# Patient Record
Sex: Male | Born: 1935 | Race: White | Hispanic: No | Marital: Married | Smoking: Current every day smoker
Health system: Southern US, Community
[De-identification: ages and names within clinical notes are randomized; demographics above are authoritative.]

## PROBLEM LIST (undated history)

## (undated) DIAGNOSIS — N289 Disorder of kidney and ureter, unspecified: Secondary | ICD-10-CM

## (undated) DIAGNOSIS — J449 Chronic obstructive pulmonary disease, unspecified: Secondary | ICD-10-CM

## (undated) DIAGNOSIS — C649 Malignant neoplasm of unspecified kidney, except renal pelvis: Secondary | ICD-10-CM

## (undated) DIAGNOSIS — M1990 Unspecified osteoarthritis, unspecified site: Secondary | ICD-10-CM

## (undated) DIAGNOSIS — E559 Vitamin D deficiency, unspecified: Secondary | ICD-10-CM

## (undated) DIAGNOSIS — H269 Unspecified cataract: Secondary | ICD-10-CM

## (undated) DIAGNOSIS — K219 Gastro-esophageal reflux disease without esophagitis: Secondary | ICD-10-CM

## (undated) DIAGNOSIS — I10 Essential (primary) hypertension: Secondary | ICD-10-CM

## (undated) DIAGNOSIS — C61 Malignant neoplasm of prostate: Secondary | ICD-10-CM

## (undated) DIAGNOSIS — E785 Hyperlipidemia, unspecified: Secondary | ICD-10-CM

## (undated) DIAGNOSIS — I2510 Atherosclerotic heart disease of native coronary artery without angina pectoris: Secondary | ICD-10-CM

## (undated) DIAGNOSIS — K644 Residual hemorrhoidal skin tags: Secondary | ICD-10-CM

## (undated) DIAGNOSIS — J189 Pneumonia, unspecified organism: Secondary | ICD-10-CM

## (undated) DIAGNOSIS — E7800 Pure hypercholesterolemia, unspecified: Secondary | ICD-10-CM

## (undated) DIAGNOSIS — R634 Abnormal weight loss: Secondary | ICD-10-CM

## (undated) DIAGNOSIS — I719 Aortic aneurysm of unspecified site, without rupture: Secondary | ICD-10-CM

## (undated) HISTORY — PX: COLON SURGERY: SHX602

## (undated) HISTORY — DX: Pure hypercholesterolemia, unspecified: E78.00

## (undated) HISTORY — DX: Essential (primary) hypertension: I10

## (undated) HISTORY — DX: Vitamin D deficiency, unspecified: E55.9

## (undated) HISTORY — DX: Malignant neoplasm of prostate: C61

## (undated) HISTORY — DX: Malignant neoplasm of unspecified kidney, except renal pelvis: C64.9

## (undated) HISTORY — DX: Aortic aneurysm of unspecified site, without rupture: I71.9

## (undated) HISTORY — DX: Disorder of kidney and ureter, unspecified: N28.9

## (undated) HISTORY — DX: Atherosclerotic heart disease of native coronary artery without angina pectoris: I25.10

## (undated) HISTORY — DX: Chronic obstructive pulmonary disease, unspecified: J44.9

## (undated) HISTORY — DX: Abnormal weight loss: R63.4

## (undated) HISTORY — DX: Hyperlipidemia, unspecified: E78.5

## (undated) HISTORY — DX: Residual hemorrhoidal skin tags: K64.4

## (undated) HISTORY — DX: Unspecified osteoarthritis, unspecified site: M19.90

## (undated) HISTORY — DX: Unspecified cataract: H26.9

## (undated) HISTORY — DX: Pneumonia, unspecified organism: J18.9

---

## 1995-01-27 DIAGNOSIS — C649 Malignant neoplasm of unspecified kidney, except renal pelvis: Secondary | ICD-10-CM

## 1995-01-27 HISTORY — PX: KIDNEY SURGERY: SHX687

## 1995-01-27 HISTORY — DX: Malignant neoplasm of unspecified kidney, except renal pelvis: C64.9

## 1998-01-26 DIAGNOSIS — I2510 Atherosclerotic heart disease of native coronary artery without angina pectoris: Secondary | ICD-10-CM | POA: Insufficient documentation

## 1998-01-26 HISTORY — DX: Atherosclerotic heart disease of native coronary artery without angina pectoris: I25.10

## 1998-03-26 ENCOUNTER — Inpatient Hospital Stay (HOSPITAL_COMMUNITY): Admission: AD | Admit: 1998-03-26 | Discharge: 1998-03-28 | Payer: Self-pay | Admitting: *Deleted

## 1998-10-10 ENCOUNTER — Ambulatory Visit (HOSPITAL_COMMUNITY): Admission: RE | Admit: 1998-10-10 | Discharge: 1998-10-10 | Payer: Self-pay

## 1999-08-06 ENCOUNTER — Ambulatory Visit (HOSPITAL_COMMUNITY): Admission: RE | Admit: 1999-08-06 | Discharge: 1999-08-06 | Payer: Self-pay

## 2011-04-06 DIAGNOSIS — E785 Hyperlipidemia, unspecified: Secondary | ICD-10-CM | POA: Diagnosis not present

## 2011-04-06 DIAGNOSIS — M25569 Pain in unspecified knee: Secondary | ICD-10-CM | POA: Diagnosis not present

## 2011-04-06 DIAGNOSIS — I10 Essential (primary) hypertension: Secondary | ICD-10-CM | POA: Diagnosis not present

## 2011-04-06 DIAGNOSIS — I719 Aortic aneurysm of unspecified site, without rupture: Secondary | ICD-10-CM | POA: Diagnosis not present

## 2011-04-08 DIAGNOSIS — Z6828 Body mass index (BMI) 28.0-28.9, adult: Secondary | ICD-10-CM | POA: Diagnosis not present

## 2011-04-08 DIAGNOSIS — M25569 Pain in unspecified knee: Secondary | ICD-10-CM | POA: Diagnosis not present

## 2011-04-08 DIAGNOSIS — R5381 Other malaise: Secondary | ICD-10-CM | POA: Diagnosis not present

## 2011-04-08 DIAGNOSIS — J111 Influenza due to unidentified influenza virus with other respiratory manifestations: Secondary | ICD-10-CM | POA: Diagnosis not present

## 2011-04-08 DIAGNOSIS — M2550 Pain in unspecified joint: Secondary | ICD-10-CM | POA: Diagnosis not present

## 2011-04-21 DIAGNOSIS — R5383 Other fatigue: Secondary | ICD-10-CM | POA: Diagnosis not present

## 2011-04-21 DIAGNOSIS — I10 Essential (primary) hypertension: Secondary | ICD-10-CM | POA: Diagnosis not present

## 2011-04-21 DIAGNOSIS — B9789 Other viral agents as the cause of diseases classified elsewhere: Secondary | ICD-10-CM | POA: Diagnosis not present

## 2011-05-14 DIAGNOSIS — E785 Hyperlipidemia, unspecified: Secondary | ICD-10-CM | POA: Diagnosis not present

## 2011-05-14 DIAGNOSIS — I2510 Atherosclerotic heart disease of native coronary artery without angina pectoris: Secondary | ICD-10-CM | POA: Diagnosis not present

## 2011-05-14 DIAGNOSIS — I10 Essential (primary) hypertension: Secondary | ICD-10-CM | POA: Diagnosis not present

## 2011-05-14 DIAGNOSIS — I719 Aortic aneurysm of unspecified site, without rupture: Secondary | ICD-10-CM | POA: Diagnosis not present

## 2011-05-19 DIAGNOSIS — I2510 Atherosclerotic heart disease of native coronary artery without angina pectoris: Secondary | ICD-10-CM | POA: Diagnosis not present

## 2011-07-27 DIAGNOSIS — M1990 Unspecified osteoarthritis, unspecified site: Secondary | ICD-10-CM | POA: Diagnosis not present

## 2011-07-27 DIAGNOSIS — Z0000 Encounter for general adult medical examination without abnormal findings: Secondary | ICD-10-CM | POA: Diagnosis not present

## 2011-07-27 DIAGNOSIS — Z6828 Body mass index (BMI) 28.0-28.9, adult: Secondary | ICD-10-CM | POA: Diagnosis not present

## 2011-07-27 DIAGNOSIS — R5381 Other malaise: Secondary | ICD-10-CM | POA: Diagnosis not present

## 2011-07-27 DIAGNOSIS — Z1212 Encounter for screening for malignant neoplasm of rectum: Secondary | ICD-10-CM | POA: Diagnosis not present

## 2011-07-27 DIAGNOSIS — I10 Essential (primary) hypertension: Secondary | ICD-10-CM | POA: Diagnosis not present

## 2011-07-27 DIAGNOSIS — Z125 Encounter for screening for malignant neoplasm of prostate: Secondary | ICD-10-CM | POA: Diagnosis not present

## 2011-10-28 DIAGNOSIS — E785 Hyperlipidemia, unspecified: Secondary | ICD-10-CM | POA: Diagnosis not present

## 2011-10-28 DIAGNOSIS — I10 Essential (primary) hypertension: Secondary | ICD-10-CM | POA: Diagnosis not present

## 2011-10-28 DIAGNOSIS — Z6828 Body mass index (BMI) 28.0-28.9, adult: Secondary | ICD-10-CM | POA: Diagnosis not present

## 2011-10-28 DIAGNOSIS — I719 Aortic aneurysm of unspecified site, without rupture: Secondary | ICD-10-CM | POA: Diagnosis not present

## 2011-10-28 DIAGNOSIS — Z79899 Other long term (current) drug therapy: Secondary | ICD-10-CM | POA: Diagnosis not present

## 2011-11-10 DIAGNOSIS — Z1211 Encounter for screening for malignant neoplasm of colon: Secondary | ICD-10-CM | POA: Diagnosis not present

## 2011-11-10 DIAGNOSIS — Z86010 Personal history of colonic polyps: Secondary | ICD-10-CM | POA: Diagnosis not present

## 2011-11-12 DIAGNOSIS — I2510 Atherosclerotic heart disease of native coronary artery without angina pectoris: Secondary | ICD-10-CM | POA: Diagnosis not present

## 2011-11-12 DIAGNOSIS — I10 Essential (primary) hypertension: Secondary | ICD-10-CM | POA: Diagnosis not present

## 2011-11-12 DIAGNOSIS — I719 Aortic aneurysm of unspecified site, without rupture: Secondary | ICD-10-CM | POA: Diagnosis not present

## 2011-11-12 DIAGNOSIS — E785 Hyperlipidemia, unspecified: Secondary | ICD-10-CM | POA: Diagnosis not present

## 2011-12-09 DIAGNOSIS — I2510 Atherosclerotic heart disease of native coronary artery without angina pectoris: Secondary | ICD-10-CM | POA: Diagnosis not present

## 2011-12-09 DIAGNOSIS — I10 Essential (primary) hypertension: Secondary | ICD-10-CM | POA: Diagnosis not present

## 2011-12-09 DIAGNOSIS — Z1211 Encounter for screening for malignant neoplasm of colon: Secondary | ICD-10-CM | POA: Diagnosis not present

## 2011-12-09 DIAGNOSIS — J449 Chronic obstructive pulmonary disease, unspecified: Secondary | ICD-10-CM | POA: Diagnosis not present

## 2011-12-09 DIAGNOSIS — Z79899 Other long term (current) drug therapy: Secondary | ICD-10-CM | POA: Diagnosis not present

## 2011-12-09 DIAGNOSIS — Z9861 Coronary angioplasty status: Secondary | ICD-10-CM | POA: Diagnosis not present

## 2011-12-09 DIAGNOSIS — K5730 Diverticulosis of large intestine without perforation or abscess without bleeding: Secondary | ICD-10-CM | POA: Diagnosis not present

## 2011-12-09 DIAGNOSIS — E7800 Pure hypercholesterolemia, unspecified: Secondary | ICD-10-CM | POA: Diagnosis not present

## 2011-12-09 DIAGNOSIS — D126 Benign neoplasm of colon, unspecified: Secondary | ICD-10-CM | POA: Diagnosis not present

## 2011-12-09 DIAGNOSIS — Z86010 Personal history of colonic polyps: Secondary | ICD-10-CM | POA: Diagnosis not present

## 2011-12-09 DIAGNOSIS — Z7982 Long term (current) use of aspirin: Secondary | ICD-10-CM | POA: Diagnosis not present

## 2011-12-09 HISTORY — PX: COLONOSCOPY: SHX174

## 2012-05-24 DIAGNOSIS — I719 Aortic aneurysm of unspecified site, without rupture: Secondary | ICD-10-CM | POA: Diagnosis not present

## 2012-05-24 DIAGNOSIS — I10 Essential (primary) hypertension: Secondary | ICD-10-CM | POA: Diagnosis not present

## 2012-05-24 DIAGNOSIS — I2510 Atherosclerotic heart disease of native coronary artery without angina pectoris: Secondary | ICD-10-CM | POA: Diagnosis not present

## 2012-05-24 DIAGNOSIS — E785 Hyperlipidemia, unspecified: Secondary | ICD-10-CM | POA: Diagnosis not present

## 2012-05-30 DIAGNOSIS — I714 Abdominal aortic aneurysm, without rupture: Secondary | ICD-10-CM | POA: Diagnosis not present

## 2012-06-02 DIAGNOSIS — E782 Mixed hyperlipidemia: Secondary | ICD-10-CM | POA: Diagnosis not present

## 2012-06-02 DIAGNOSIS — E559 Vitamin D deficiency, unspecified: Secondary | ICD-10-CM | POA: Diagnosis not present

## 2012-06-02 DIAGNOSIS — M1990 Unspecified osteoarthritis, unspecified site: Secondary | ICD-10-CM | POA: Diagnosis not present

## 2012-06-02 DIAGNOSIS — Z125 Encounter for screening for malignant neoplasm of prostate: Secondary | ICD-10-CM | POA: Diagnosis not present

## 2012-06-02 DIAGNOSIS — Z79899 Other long term (current) drug therapy: Secondary | ICD-10-CM | POA: Diagnosis not present

## 2012-06-02 DIAGNOSIS — Z0000 Encounter for general adult medical examination without abnormal findings: Secondary | ICD-10-CM | POA: Diagnosis not present

## 2012-06-02 DIAGNOSIS — E785 Hyperlipidemia, unspecified: Secondary | ICD-10-CM | POA: Diagnosis not present

## 2012-06-02 DIAGNOSIS — I10 Essential (primary) hypertension: Secondary | ICD-10-CM | POA: Diagnosis not present

## 2012-06-13 DIAGNOSIS — I10 Essential (primary) hypertension: Secondary | ICD-10-CM | POA: Diagnosis not present

## 2012-06-13 DIAGNOSIS — I719 Aortic aneurysm of unspecified site, without rupture: Secondary | ICD-10-CM | POA: Diagnosis not present

## 2012-06-13 DIAGNOSIS — E785 Hyperlipidemia, unspecified: Secondary | ICD-10-CM | POA: Diagnosis not present

## 2012-06-23 DIAGNOSIS — M7710 Lateral epicondylitis, unspecified elbow: Secondary | ICD-10-CM | POA: Diagnosis not present

## 2012-06-23 DIAGNOSIS — I719 Aortic aneurysm of unspecified site, without rupture: Secondary | ICD-10-CM | POA: Diagnosis not present

## 2012-06-23 DIAGNOSIS — I714 Abdominal aortic aneurysm, without rupture: Secondary | ICD-10-CM | POA: Diagnosis not present

## 2012-09-08 DIAGNOSIS — I719 Aortic aneurysm of unspecified site, without rupture: Secondary | ICD-10-CM | POA: Diagnosis not present

## 2012-09-08 DIAGNOSIS — M1990 Unspecified osteoarthritis, unspecified site: Secondary | ICD-10-CM | POA: Diagnosis not present

## 2012-09-08 DIAGNOSIS — I10 Essential (primary) hypertension: Secondary | ICD-10-CM | POA: Diagnosis not present

## 2012-09-08 DIAGNOSIS — E785 Hyperlipidemia, unspecified: Secondary | ICD-10-CM | POA: Diagnosis not present

## 2012-10-30 DIAGNOSIS — S20229A Contusion of unspecified back wall of thorax, initial encounter: Secondary | ICD-10-CM | POA: Diagnosis not present

## 2012-10-30 DIAGNOSIS — S2249XA Multiple fractures of ribs, unspecified side, initial encounter for closed fracture: Secondary | ICD-10-CM | POA: Diagnosis not present

## 2012-10-30 DIAGNOSIS — Z79899 Other long term (current) drug therapy: Secondary | ICD-10-CM | POA: Diagnosis not present

## 2012-10-30 DIAGNOSIS — I2510 Atherosclerotic heart disease of native coronary artery without angina pectoris: Secondary | ICD-10-CM | POA: Diagnosis not present

## 2012-10-30 DIAGNOSIS — I10 Essential (primary) hypertension: Secondary | ICD-10-CM | POA: Diagnosis not present

## 2012-10-30 DIAGNOSIS — S27329A Contusion of lung, unspecified, initial encounter: Secondary | ICD-10-CM | POA: Diagnosis not present

## 2012-10-30 DIAGNOSIS — T1490XA Injury, unspecified, initial encounter: Secondary | ICD-10-CM | POA: Diagnosis not present

## 2012-10-30 DIAGNOSIS — J449 Chronic obstructive pulmonary disease, unspecified: Secondary | ICD-10-CM | POA: Diagnosis not present

## 2012-10-30 DIAGNOSIS — W010XXA Fall on same level from slipping, tripping and stumbling without subsequent striking against object, initial encounter: Secondary | ICD-10-CM | POA: Diagnosis not present

## 2012-10-30 DIAGNOSIS — R109 Unspecified abdominal pain: Secondary | ICD-10-CM | POA: Diagnosis not present

## 2012-10-30 DIAGNOSIS — R9431 Abnormal electrocardiogram [ECG] [EKG]: Secondary | ICD-10-CM | POA: Diagnosis not present

## 2012-10-30 DIAGNOSIS — F17200 Nicotine dependence, unspecified, uncomplicated: Secondary | ICD-10-CM | POA: Diagnosis not present

## 2012-11-03 DIAGNOSIS — J209 Acute bronchitis, unspecified: Secondary | ICD-10-CM | POA: Diagnosis not present

## 2012-11-03 DIAGNOSIS — S2239XA Fracture of one rib, unspecified side, initial encounter for closed fracture: Secondary | ICD-10-CM | POA: Diagnosis not present

## 2012-11-03 DIAGNOSIS — W19XXXA Unspecified fall, initial encounter: Secondary | ICD-10-CM | POA: Diagnosis not present

## 2012-12-13 DIAGNOSIS — Z79899 Other long term (current) drug therapy: Secondary | ICD-10-CM | POA: Diagnosis not present

## 2012-12-13 DIAGNOSIS — E785 Hyperlipidemia, unspecified: Secondary | ICD-10-CM | POA: Diagnosis not present

## 2012-12-13 DIAGNOSIS — I10 Essential (primary) hypertension: Secondary | ICD-10-CM | POA: Diagnosis not present

## 2012-12-13 DIAGNOSIS — E782 Mixed hyperlipidemia: Secondary | ICD-10-CM | POA: Diagnosis not present

## 2012-12-13 DIAGNOSIS — E039 Hypothyroidism, unspecified: Secondary | ICD-10-CM | POA: Diagnosis not present

## 2012-12-13 DIAGNOSIS — I719 Aortic aneurysm of unspecified site, without rupture: Secondary | ICD-10-CM | POA: Diagnosis not present

## 2012-12-13 DIAGNOSIS — E559 Vitamin D deficiency, unspecified: Secondary | ICD-10-CM | POA: Diagnosis not present

## 2012-12-21 DIAGNOSIS — I714 Abdominal aortic aneurysm, without rupture: Secondary | ICD-10-CM | POA: Diagnosis not present

## 2013-06-08 DIAGNOSIS — I2510 Atherosclerotic heart disease of native coronary artery without angina pectoris: Secondary | ICD-10-CM | POA: Diagnosis not present

## 2013-06-08 DIAGNOSIS — J45902 Unspecified asthma with status asthmaticus: Secondary | ICD-10-CM | POA: Diagnosis not present

## 2013-06-08 DIAGNOSIS — E039 Hypothyroidism, unspecified: Secondary | ICD-10-CM | POA: Diagnosis not present

## 2013-06-08 DIAGNOSIS — Z79899 Other long term (current) drug therapy: Secondary | ICD-10-CM | POA: Diagnosis not present

## 2013-06-08 DIAGNOSIS — E559 Vitamin D deficiency, unspecified: Secondary | ICD-10-CM | POA: Diagnosis not present

## 2013-06-08 DIAGNOSIS — E785 Hyperlipidemia, unspecified: Secondary | ICD-10-CM | POA: Diagnosis not present

## 2013-06-08 DIAGNOSIS — I719 Aortic aneurysm of unspecified site, without rupture: Secondary | ICD-10-CM | POA: Diagnosis not present

## 2013-06-08 DIAGNOSIS — J309 Allergic rhinitis, unspecified: Secondary | ICD-10-CM | POA: Diagnosis not present

## 2013-06-08 DIAGNOSIS — I10 Essential (primary) hypertension: Secondary | ICD-10-CM | POA: Diagnosis not present

## 2013-06-08 DIAGNOSIS — Z23 Encounter for immunization: Secondary | ICD-10-CM | POA: Diagnosis not present

## 2013-06-20 DIAGNOSIS — D485 Neoplasm of uncertain behavior of skin: Secondary | ICD-10-CM | POA: Diagnosis not present

## 2013-06-20 DIAGNOSIS — I714 Abdominal aortic aneurysm, without rupture, unspecified: Secondary | ICD-10-CM | POA: Diagnosis not present

## 2013-06-20 DIAGNOSIS — I2510 Atherosclerotic heart disease of native coronary artery without angina pectoris: Secondary | ICD-10-CM | POA: Diagnosis not present

## 2013-06-20 DIAGNOSIS — I10 Essential (primary) hypertension: Secondary | ICD-10-CM | POA: Diagnosis not present

## 2013-06-20 DIAGNOSIS — E785 Hyperlipidemia, unspecified: Secondary | ICD-10-CM | POA: Diagnosis not present

## 2013-06-20 DIAGNOSIS — L570 Actinic keratosis: Secondary | ICD-10-CM | POA: Diagnosis not present

## 2013-07-06 DIAGNOSIS — I723 Aneurysm of iliac artery: Secondary | ICD-10-CM | POA: Diagnosis not present

## 2013-07-06 DIAGNOSIS — I714 Abdominal aortic aneurysm, without rupture, unspecified: Secondary | ICD-10-CM | POA: Diagnosis not present

## 2013-07-20 DIAGNOSIS — L723 Sebaceous cyst: Secondary | ICD-10-CM | POA: Diagnosis not present

## 2013-09-26 DIAGNOSIS — Z0000 Encounter for general adult medical examination without abnormal findings: Secondary | ICD-10-CM | POA: Diagnosis not present

## 2013-09-26 DIAGNOSIS — Z125 Encounter for screening for malignant neoplasm of prostate: Secondary | ICD-10-CM | POA: Diagnosis not present

## 2013-09-26 DIAGNOSIS — I10 Essential (primary) hypertension: Secondary | ICD-10-CM | POA: Diagnosis not present

## 2013-10-13 DIAGNOSIS — M62838 Other muscle spasm: Secondary | ICD-10-CM | POA: Diagnosis not present

## 2013-12-18 DIAGNOSIS — I714 Abdominal aortic aneurysm, without rupture: Secondary | ICD-10-CM | POA: Diagnosis not present

## 2013-12-18 DIAGNOSIS — I10 Essential (primary) hypertension: Secondary | ICD-10-CM | POA: Diagnosis not present

## 2013-12-18 DIAGNOSIS — I2510 Atherosclerotic heart disease of native coronary artery without angina pectoris: Secondary | ICD-10-CM | POA: Diagnosis not present

## 2014-01-01 DIAGNOSIS — Z905 Acquired absence of kidney: Secondary | ICD-10-CM | POA: Diagnosis not present

## 2014-01-01 DIAGNOSIS — E559 Vitamin D deficiency, unspecified: Secondary | ICD-10-CM | POA: Diagnosis not present

## 2014-01-01 DIAGNOSIS — M1990 Unspecified osteoarthritis, unspecified site: Secondary | ICD-10-CM | POA: Diagnosis not present

## 2014-01-01 DIAGNOSIS — I10 Essential (primary) hypertension: Secondary | ICD-10-CM | POA: Diagnosis not present

## 2014-01-01 DIAGNOSIS — Z2821 Immunization not carried out because of patient refusal: Secondary | ICD-10-CM | POA: Diagnosis not present

## 2014-01-01 DIAGNOSIS — E039 Hypothyroidism, unspecified: Secondary | ICD-10-CM | POA: Diagnosis not present

## 2014-01-01 DIAGNOSIS — Z79899 Other long term (current) drug therapy: Secondary | ICD-10-CM | POA: Diagnosis not present

## 2014-01-01 DIAGNOSIS — E785 Hyperlipidemia, unspecified: Secondary | ICD-10-CM | POA: Diagnosis not present

## 2014-01-01 DIAGNOSIS — Z23 Encounter for immunization: Secondary | ICD-10-CM | POA: Diagnosis not present

## 2014-01-01 DIAGNOSIS — I719 Aortic aneurysm of unspecified site, without rupture: Secondary | ICD-10-CM | POA: Diagnosis not present

## 2014-01-02 DIAGNOSIS — L570 Actinic keratosis: Secondary | ICD-10-CM | POA: Diagnosis not present

## 2014-01-10 DIAGNOSIS — I723 Aneurysm of iliac artery: Secondary | ICD-10-CM | POA: Diagnosis not present

## 2014-01-10 DIAGNOSIS — I714 Abdominal aortic aneurysm, without rupture: Secondary | ICD-10-CM | POA: Diagnosis not present

## 2014-01-31 DIAGNOSIS — I10 Essential (primary) hypertension: Secondary | ICD-10-CM | POA: Diagnosis not present

## 2014-01-31 DIAGNOSIS — Z905 Acquired absence of kidney: Secondary | ICD-10-CM | POA: Diagnosis not present

## 2014-01-31 DIAGNOSIS — E785 Hyperlipidemia, unspecified: Secondary | ICD-10-CM | POA: Diagnosis not present

## 2014-01-31 DIAGNOSIS — I714 Abdominal aortic aneurysm, without rupture: Secondary | ICD-10-CM | POA: Diagnosis not present

## 2014-01-31 DIAGNOSIS — I723 Aneurysm of iliac artery: Secondary | ICD-10-CM | POA: Diagnosis not present

## 2014-04-03 DIAGNOSIS — I10 Essential (primary) hypertension: Secondary | ICD-10-CM | POA: Diagnosis not present

## 2014-04-03 DIAGNOSIS — Z905 Acquired absence of kidney: Secondary | ICD-10-CM | POA: Diagnosis not present

## 2014-04-03 DIAGNOSIS — M1990 Unspecified osteoarthritis, unspecified site: Secondary | ICD-10-CM | POA: Diagnosis not present

## 2014-04-03 DIAGNOSIS — C61 Malignant neoplasm of prostate: Secondary | ICD-10-CM | POA: Diagnosis not present

## 2014-04-03 DIAGNOSIS — E785 Hyperlipidemia, unspecified: Secondary | ICD-10-CM | POA: Diagnosis not present

## 2014-04-03 DIAGNOSIS — Q6100 Congenital renal cyst, unspecified: Secondary | ICD-10-CM | POA: Diagnosis not present

## 2014-04-03 DIAGNOSIS — I719 Aortic aneurysm of unspecified site, without rupture: Secondary | ICD-10-CM | POA: Diagnosis not present

## 2014-04-03 DIAGNOSIS — E039 Hypothyroidism, unspecified: Secondary | ICD-10-CM | POA: Diagnosis not present

## 2014-04-03 DIAGNOSIS — Z6827 Body mass index (BMI) 27.0-27.9, adult: Secondary | ICD-10-CM | POA: Diagnosis not present

## 2014-07-04 DIAGNOSIS — Z6826 Body mass index (BMI) 26.0-26.9, adult: Secondary | ICD-10-CM | POA: Diagnosis not present

## 2014-07-04 DIAGNOSIS — E039 Hypothyroidism, unspecified: Secondary | ICD-10-CM | POA: Diagnosis not present

## 2014-07-04 DIAGNOSIS — I10 Essential (primary) hypertension: Secondary | ICD-10-CM | POA: Diagnosis not present

## 2014-07-04 DIAGNOSIS — E559 Vitamin D deficiency, unspecified: Secondary | ICD-10-CM | POA: Diagnosis not present

## 2014-07-04 DIAGNOSIS — Z79899 Other long term (current) drug therapy: Secondary | ICD-10-CM | POA: Diagnosis not present

## 2014-07-04 DIAGNOSIS — M1990 Unspecified osteoarthritis, unspecified site: Secondary | ICD-10-CM | POA: Diagnosis not present

## 2014-07-04 DIAGNOSIS — N189 Chronic kidney disease, unspecified: Secondary | ICD-10-CM | POA: Diagnosis not present

## 2014-07-04 DIAGNOSIS — Z9181 History of falling: Secondary | ICD-10-CM | POA: Diagnosis not present

## 2014-07-04 DIAGNOSIS — E785 Hyperlipidemia, unspecified: Secondary | ICD-10-CM | POA: Diagnosis not present

## 2014-07-04 DIAGNOSIS — J019 Acute sinusitis, unspecified: Secondary | ICD-10-CM | POA: Diagnosis not present

## 2014-07-04 DIAGNOSIS — Z1389 Encounter for screening for other disorder: Secondary | ICD-10-CM | POA: Diagnosis not present

## 2014-08-10 DIAGNOSIS — I714 Abdominal aortic aneurysm, without rupture: Secondary | ICD-10-CM | POA: Diagnosis not present

## 2014-08-10 DIAGNOSIS — I723 Aneurysm of iliac artery: Secondary | ICD-10-CM | POA: Diagnosis not present

## 2014-08-10 DIAGNOSIS — I719 Aortic aneurysm of unspecified site, without rupture: Secondary | ICD-10-CM | POA: Diagnosis not present

## 2014-09-13 DIAGNOSIS — I10 Essential (primary) hypertension: Secondary | ICD-10-CM | POA: Diagnosis not present

## 2014-09-13 DIAGNOSIS — E785 Hyperlipidemia, unspecified: Secondary | ICD-10-CM | POA: Diagnosis not present

## 2014-09-13 DIAGNOSIS — I714 Abdominal aortic aneurysm, without rupture: Secondary | ICD-10-CM | POA: Diagnosis not present

## 2014-09-13 DIAGNOSIS — I723 Aneurysm of iliac artery: Secondary | ICD-10-CM | POA: Diagnosis not present

## 2014-09-26 DIAGNOSIS — I2510 Atherosclerotic heart disease of native coronary artery without angina pectoris: Secondary | ICD-10-CM | POA: Insufficient documentation

## 2014-09-26 DIAGNOSIS — E782 Mixed hyperlipidemia: Secondary | ICD-10-CM | POA: Insufficient documentation

## 2014-09-26 DIAGNOSIS — I440 Atrioventricular block, first degree: Secondary | ICD-10-CM | POA: Diagnosis not present

## 2014-09-26 DIAGNOSIS — I714 Abdominal aortic aneurysm, without rupture: Secondary | ICD-10-CM | POA: Diagnosis not present

## 2014-09-26 DIAGNOSIS — Z0000 Encounter for general adult medical examination without abnormal findings: Secondary | ICD-10-CM | POA: Diagnosis not present

## 2014-09-26 DIAGNOSIS — I10 Essential (primary) hypertension: Secondary | ICD-10-CM | POA: Insufficient documentation

## 2014-09-26 DIAGNOSIS — I452 Bifascicular block: Secondary | ICD-10-CM | POA: Diagnosis not present

## 2014-09-26 DIAGNOSIS — R001 Bradycardia, unspecified: Secondary | ICD-10-CM | POA: Diagnosis not present

## 2014-09-26 DIAGNOSIS — I4510 Unspecified right bundle-branch block: Secondary | ICD-10-CM | POA: Diagnosis not present

## 2014-09-26 HISTORY — DX: Atherosclerotic heart disease of native coronary artery without angina pectoris: I25.10

## 2014-09-26 HISTORY — DX: Mixed hyperlipidemia: E78.2

## 2014-09-26 HISTORY — DX: Essential (primary) hypertension: I10

## 2014-10-31 DIAGNOSIS — L570 Actinic keratosis: Secondary | ICD-10-CM | POA: Diagnosis not present

## 2014-10-31 DIAGNOSIS — C4441 Basal cell carcinoma of skin of scalp and neck: Secondary | ICD-10-CM | POA: Diagnosis not present

## 2014-12-12 DIAGNOSIS — Z6826 Body mass index (BMI) 26.0-26.9, adult: Secondary | ICD-10-CM | POA: Diagnosis not present

## 2014-12-12 DIAGNOSIS — E785 Hyperlipidemia, unspecified: Secondary | ICD-10-CM | POA: Diagnosis not present

## 2014-12-12 DIAGNOSIS — I10 Essential (primary) hypertension: Secondary | ICD-10-CM | POA: Diagnosis not present

## 2014-12-12 DIAGNOSIS — Z79899 Other long term (current) drug therapy: Secondary | ICD-10-CM | POA: Diagnosis not present

## 2014-12-12 DIAGNOSIS — E663 Overweight: Secondary | ICD-10-CM | POA: Diagnosis not present

## 2014-12-12 DIAGNOSIS — Z0000 Encounter for general adult medical examination without abnormal findings: Secondary | ICD-10-CM | POA: Diagnosis not present

## 2014-12-12 DIAGNOSIS — E039 Hypothyroidism, unspecified: Secondary | ICD-10-CM | POA: Diagnosis not present

## 2014-12-12 DIAGNOSIS — F439 Reaction to severe stress, unspecified: Secondary | ICD-10-CM | POA: Diagnosis not present

## 2014-12-12 DIAGNOSIS — E559 Vitamin D deficiency, unspecified: Secondary | ICD-10-CM | POA: Diagnosis not present

## 2015-02-12 DIAGNOSIS — I714 Abdominal aortic aneurysm, without rupture: Secondary | ICD-10-CM | POA: Diagnosis not present

## 2015-03-25 DIAGNOSIS — I714 Abdominal aortic aneurysm, without rupture: Secondary | ICD-10-CM | POA: Diagnosis not present

## 2015-03-25 DIAGNOSIS — E782 Mixed hyperlipidemia: Secondary | ICD-10-CM | POA: Diagnosis not present

## 2015-03-25 DIAGNOSIS — I10 Essential (primary) hypertension: Secondary | ICD-10-CM | POA: Diagnosis not present

## 2015-03-25 DIAGNOSIS — I2510 Atherosclerotic heart disease of native coronary artery without angina pectoris: Secondary | ICD-10-CM | POA: Diagnosis not present

## 2015-04-11 DIAGNOSIS — E663 Overweight: Secondary | ICD-10-CM | POA: Diagnosis not present

## 2015-04-11 DIAGNOSIS — M1990 Unspecified osteoarthritis, unspecified site: Secondary | ICD-10-CM | POA: Diagnosis not present

## 2015-04-11 DIAGNOSIS — E785 Hyperlipidemia, unspecified: Secondary | ICD-10-CM | POA: Diagnosis not present

## 2015-04-11 DIAGNOSIS — I719 Aortic aneurysm of unspecified site, without rupture: Secondary | ICD-10-CM | POA: Diagnosis not present

## 2015-04-11 DIAGNOSIS — R197 Diarrhea, unspecified: Secondary | ICD-10-CM | POA: Diagnosis not present

## 2015-04-11 DIAGNOSIS — E039 Hypothyroidism, unspecified: Secondary | ICD-10-CM | POA: Diagnosis not present

## 2015-04-11 DIAGNOSIS — N189 Chronic kidney disease, unspecified: Secondary | ICD-10-CM | POA: Diagnosis not present

## 2015-04-11 DIAGNOSIS — Z1389 Encounter for screening for other disorder: Secondary | ICD-10-CM | POA: Diagnosis not present

## 2015-04-11 DIAGNOSIS — I10 Essential (primary) hypertension: Secondary | ICD-10-CM | POA: Diagnosis not present

## 2015-04-11 DIAGNOSIS — E559 Vitamin D deficiency, unspecified: Secondary | ICD-10-CM | POA: Diagnosis not present

## 2015-04-11 DIAGNOSIS — Z6826 Body mass index (BMI) 26.0-26.9, adult: Secondary | ICD-10-CM | POA: Diagnosis not present

## 2015-04-15 DIAGNOSIS — E875 Hyperkalemia: Secondary | ICD-10-CM | POA: Diagnosis not present

## 2015-04-24 DIAGNOSIS — S8012XA Contusion of left lower leg, initial encounter: Secondary | ICD-10-CM | POA: Diagnosis not present

## 2015-04-24 DIAGNOSIS — S0091XA Abrasion of unspecified part of head, initial encounter: Secondary | ICD-10-CM | POA: Diagnosis not present

## 2015-04-24 DIAGNOSIS — S8011XA Contusion of right lower leg, initial encounter: Secondary | ICD-10-CM | POA: Diagnosis not present

## 2015-04-24 DIAGNOSIS — S0990XA Unspecified injury of head, initial encounter: Secondary | ICD-10-CM | POA: Diagnosis not present

## 2015-05-27 DIAGNOSIS — N2581 Secondary hyperparathyroidism of renal origin: Secondary | ICD-10-CM | POA: Diagnosis not present

## 2015-05-27 DIAGNOSIS — I10 Essential (primary) hypertension: Secondary | ICD-10-CM | POA: Diagnosis not present

## 2015-05-27 DIAGNOSIS — N189 Chronic kidney disease, unspecified: Secondary | ICD-10-CM | POA: Diagnosis not present

## 2015-05-27 DIAGNOSIS — D631 Anemia in chronic kidney disease: Secondary | ICD-10-CM | POA: Diagnosis not present

## 2015-07-19 DIAGNOSIS — N189 Chronic kidney disease, unspecified: Secondary | ICD-10-CM | POA: Diagnosis not present

## 2015-07-25 DIAGNOSIS — E785 Hyperlipidemia, unspecified: Secondary | ICD-10-CM | POA: Diagnosis not present

## 2015-07-29 DIAGNOSIS — N2581 Secondary hyperparathyroidism of renal origin: Secondary | ICD-10-CM | POA: Diagnosis not present

## 2015-07-29 DIAGNOSIS — E875 Hyperkalemia: Secondary | ICD-10-CM | POA: Diagnosis not present

## 2015-07-29 DIAGNOSIS — I10 Essential (primary) hypertension: Secondary | ICD-10-CM | POA: Diagnosis not present

## 2015-07-29 DIAGNOSIS — N183 Chronic kidney disease, stage 3 (moderate): Secondary | ICD-10-CM | POA: Diagnosis not present

## 2015-07-29 DIAGNOSIS — D631 Anemia in chronic kidney disease: Secondary | ICD-10-CM | POA: Diagnosis not present

## 2015-08-30 DIAGNOSIS — I723 Aneurysm of iliac artery: Secondary | ICD-10-CM | POA: Diagnosis not present

## 2015-08-30 DIAGNOSIS — I714 Abdominal aortic aneurysm, without rupture: Secondary | ICD-10-CM | POA: Diagnosis not present

## 2015-09-19 DIAGNOSIS — I714 Abdominal aortic aneurysm, without rupture: Secondary | ICD-10-CM | POA: Diagnosis not present

## 2015-09-25 DIAGNOSIS — I10 Essential (primary) hypertension: Secondary | ICD-10-CM | POA: Diagnosis not present

## 2015-09-25 DIAGNOSIS — E782 Mixed hyperlipidemia: Secondary | ICD-10-CM | POA: Diagnosis not present

## 2015-09-25 DIAGNOSIS — I2510 Atherosclerotic heart disease of native coronary artery without angina pectoris: Secondary | ICD-10-CM | POA: Diagnosis not present

## 2015-09-25 DIAGNOSIS — I714 Abdominal aortic aneurysm, without rupture: Secondary | ICD-10-CM | POA: Diagnosis not present

## 2015-10-09 DIAGNOSIS — I719 Aortic aneurysm of unspecified site, without rupture: Secondary | ICD-10-CM | POA: Diagnosis not present

## 2015-10-09 DIAGNOSIS — Z905 Acquired absence of kidney: Secondary | ICD-10-CM | POA: Diagnosis not present

## 2015-10-09 DIAGNOSIS — E663 Overweight: Secondary | ICD-10-CM | POA: Diagnosis not present

## 2015-10-09 DIAGNOSIS — E559 Vitamin D deficiency, unspecified: Secondary | ICD-10-CM | POA: Diagnosis not present

## 2015-10-09 DIAGNOSIS — Z6825 Body mass index (BMI) 25.0-25.9, adult: Secondary | ICD-10-CM | POA: Diagnosis not present

## 2015-10-09 DIAGNOSIS — I10 Essential (primary) hypertension: Secondary | ICD-10-CM | POA: Diagnosis not present

## 2015-10-09 DIAGNOSIS — Z9181 History of falling: Secondary | ICD-10-CM | POA: Diagnosis not present

## 2015-10-09 DIAGNOSIS — Z79899 Other long term (current) drug therapy: Secondary | ICD-10-CM | POA: Diagnosis not present

## 2015-10-09 DIAGNOSIS — C61 Malignant neoplasm of prostate: Secondary | ICD-10-CM | POA: Diagnosis not present

## 2015-10-09 DIAGNOSIS — E785 Hyperlipidemia, unspecified: Secondary | ICD-10-CM | POA: Diagnosis not present

## 2015-10-10 DIAGNOSIS — I2510 Atherosclerotic heart disease of native coronary artery without angina pectoris: Secondary | ICD-10-CM | POA: Diagnosis not present

## 2015-12-02 DIAGNOSIS — D631 Anemia in chronic kidney disease: Secondary | ICD-10-CM | POA: Diagnosis not present

## 2015-12-02 DIAGNOSIS — N2581 Secondary hyperparathyroidism of renal origin: Secondary | ICD-10-CM | POA: Diagnosis not present

## 2015-12-02 DIAGNOSIS — N183 Chronic kidney disease, stage 3 (moderate): Secondary | ICD-10-CM | POA: Diagnosis not present

## 2015-12-11 DIAGNOSIS — N183 Chronic kidney disease, stage 3 (moderate): Secondary | ICD-10-CM | POA: Diagnosis not present

## 2015-12-11 DIAGNOSIS — D631 Anemia in chronic kidney disease: Secondary | ICD-10-CM | POA: Diagnosis not present

## 2015-12-11 DIAGNOSIS — I10 Essential (primary) hypertension: Secondary | ICD-10-CM | POA: Diagnosis not present

## 2015-12-11 DIAGNOSIS — N2581 Secondary hyperparathyroidism of renal origin: Secondary | ICD-10-CM | POA: Diagnosis not present

## 2015-12-31 DIAGNOSIS — N183 Chronic kidney disease, stage 3 (moderate): Secondary | ICD-10-CM | POA: Diagnosis not present

## 2016-03-03 DIAGNOSIS — J309 Allergic rhinitis, unspecified: Secondary | ICD-10-CM | POA: Diagnosis not present

## 2016-03-03 DIAGNOSIS — I10 Essential (primary) hypertension: Secondary | ICD-10-CM | POA: Diagnosis not present

## 2016-03-03 DIAGNOSIS — Z79899 Other long term (current) drug therapy: Secondary | ICD-10-CM | POA: Diagnosis not present

## 2016-03-03 DIAGNOSIS — J45909 Unspecified asthma, uncomplicated: Secondary | ICD-10-CM | POA: Diagnosis not present

## 2016-03-03 DIAGNOSIS — E785 Hyperlipidemia, unspecified: Secondary | ICD-10-CM | POA: Diagnosis not present

## 2016-03-03 DIAGNOSIS — Z2821 Immunization not carried out because of patient refusal: Secondary | ICD-10-CM | POA: Diagnosis not present

## 2016-03-03 DIAGNOSIS — M1990 Unspecified osteoarthritis, unspecified site: Secondary | ICD-10-CM | POA: Diagnosis not present

## 2016-03-03 DIAGNOSIS — E039 Hypothyroidism, unspecified: Secondary | ICD-10-CM | POA: Diagnosis not present

## 2016-03-03 DIAGNOSIS — E663 Overweight: Secondary | ICD-10-CM | POA: Diagnosis not present

## 2016-03-03 DIAGNOSIS — Z6826 Body mass index (BMI) 26.0-26.9, adult: Secondary | ICD-10-CM | POA: Diagnosis not present

## 2016-03-03 DIAGNOSIS — E559 Vitamin D deficiency, unspecified: Secondary | ICD-10-CM | POA: Diagnosis not present

## 2016-03-11 DIAGNOSIS — E875 Hyperkalemia: Secondary | ICD-10-CM | POA: Diagnosis not present

## 2016-03-25 DIAGNOSIS — D631 Anemia in chronic kidney disease: Secondary | ICD-10-CM | POA: Diagnosis not present

## 2016-03-25 DIAGNOSIS — N2581 Secondary hyperparathyroidism of renal origin: Secondary | ICD-10-CM | POA: Diagnosis not present

## 2016-03-25 DIAGNOSIS — N183 Chronic kidney disease, stage 3 (moderate): Secondary | ICD-10-CM | POA: Diagnosis not present

## 2016-03-31 DIAGNOSIS — D631 Anemia in chronic kidney disease: Secondary | ICD-10-CM | POA: Diagnosis not present

## 2016-03-31 DIAGNOSIS — N2581 Secondary hyperparathyroidism of renal origin: Secondary | ICD-10-CM | POA: Diagnosis not present

## 2016-03-31 DIAGNOSIS — I10 Essential (primary) hypertension: Secondary | ICD-10-CM | POA: Diagnosis not present

## 2016-03-31 DIAGNOSIS — N184 Chronic kidney disease, stage 4 (severe): Secondary | ICD-10-CM | POA: Diagnosis not present

## 2016-04-08 DIAGNOSIS — I714 Abdominal aortic aneurysm, without rupture: Secondary | ICD-10-CM | POA: Diagnosis not present

## 2016-04-08 DIAGNOSIS — I10 Essential (primary) hypertension: Secondary | ICD-10-CM | POA: Diagnosis not present

## 2016-04-08 DIAGNOSIS — I2510 Atherosclerotic heart disease of native coronary artery without angina pectoris: Secondary | ICD-10-CM | POA: Diagnosis not present

## 2016-04-08 DIAGNOSIS — E782 Mixed hyperlipidemia: Secondary | ICD-10-CM | POA: Diagnosis not present

## 2016-06-02 DIAGNOSIS — N189 Chronic kidney disease, unspecified: Secondary | ICD-10-CM | POA: Diagnosis not present

## 2016-06-02 DIAGNOSIS — M1990 Unspecified osteoarthritis, unspecified site: Secondary | ICD-10-CM | POA: Diagnosis not present

## 2016-06-02 DIAGNOSIS — Z6825 Body mass index (BMI) 25.0-25.9, adult: Secondary | ICD-10-CM | POA: Diagnosis not present

## 2016-06-02 DIAGNOSIS — I10 Essential (primary) hypertension: Secondary | ICD-10-CM | POA: Diagnosis not present

## 2016-06-02 DIAGNOSIS — E785 Hyperlipidemia, unspecified: Secondary | ICD-10-CM | POA: Diagnosis not present

## 2016-06-02 DIAGNOSIS — Z9181 History of falling: Secondary | ICD-10-CM | POA: Diagnosis not present

## 2016-06-02 DIAGNOSIS — E039 Hypothyroidism, unspecified: Secondary | ICD-10-CM | POA: Diagnosis not present

## 2016-06-02 DIAGNOSIS — Z1389 Encounter for screening for other disorder: Secondary | ICD-10-CM | POA: Diagnosis not present

## 2016-06-02 DIAGNOSIS — Z79899 Other long term (current) drug therapy: Secondary | ICD-10-CM | POA: Diagnosis not present

## 2016-06-02 DIAGNOSIS — E559 Vitamin D deficiency, unspecified: Secondary | ICD-10-CM | POA: Diagnosis not present

## 2016-06-02 DIAGNOSIS — E663 Overweight: Secondary | ICD-10-CM | POA: Diagnosis not present

## 2016-06-02 DIAGNOSIS — E875 Hyperkalemia: Secondary | ICD-10-CM | POA: Diagnosis not present

## 2016-06-12 DIAGNOSIS — E785 Hyperlipidemia, unspecified: Secondary | ICD-10-CM | POA: Diagnosis not present

## 2016-06-12 DIAGNOSIS — Z125 Encounter for screening for malignant neoplasm of prostate: Secondary | ICD-10-CM | POA: Diagnosis not present

## 2016-06-12 DIAGNOSIS — Z0000 Encounter for general adult medical examination without abnormal findings: Secondary | ICD-10-CM | POA: Diagnosis not present

## 2016-06-23 DIAGNOSIS — N184 Chronic kidney disease, stage 4 (severe): Secondary | ICD-10-CM | POA: Diagnosis not present

## 2016-06-23 DIAGNOSIS — N2581 Secondary hyperparathyroidism of renal origin: Secondary | ICD-10-CM | POA: Diagnosis not present

## 2016-06-29 DIAGNOSIS — D631 Anemia in chronic kidney disease: Secondary | ICD-10-CM | POA: Diagnosis not present

## 2016-06-29 DIAGNOSIS — Z720 Tobacco use: Secondary | ICD-10-CM | POA: Diagnosis not present

## 2016-06-29 DIAGNOSIS — I10 Essential (primary) hypertension: Secondary | ICD-10-CM | POA: Diagnosis not present

## 2016-06-29 DIAGNOSIS — N2581 Secondary hyperparathyroidism of renal origin: Secondary | ICD-10-CM | POA: Diagnosis not present

## 2016-06-29 DIAGNOSIS — N183 Chronic kidney disease, stage 3 (moderate): Secondary | ICD-10-CM | POA: Diagnosis not present

## 2016-09-15 DIAGNOSIS — F17200 Nicotine dependence, unspecified, uncomplicated: Secondary | ICD-10-CM | POA: Insufficient documentation

## 2016-09-15 DIAGNOSIS — I714 Abdominal aortic aneurysm, without rupture, unspecified: Secondary | ICD-10-CM | POA: Insufficient documentation

## 2016-09-15 DIAGNOSIS — I723 Aneurysm of iliac artery: Secondary | ICD-10-CM | POA: Insufficient documentation

## 2016-09-15 HISTORY — DX: Nicotine dependence, unspecified, uncomplicated: F17.200

## 2016-09-15 HISTORY — DX: Abdominal aortic aneurysm, without rupture: I71.4

## 2016-09-15 HISTORY — DX: Aneurysm of iliac artery: I72.3

## 2016-09-15 HISTORY — DX: Abdominal aortic aneurysm, without rupture, unspecified: I71.40

## 2016-09-29 DIAGNOSIS — R1084 Generalized abdominal pain: Secondary | ICD-10-CM | POA: Diagnosis not present

## 2016-09-29 DIAGNOSIS — R634 Abnormal weight loss: Secondary | ICD-10-CM | POA: Diagnosis not present

## 2016-09-29 DIAGNOSIS — R197 Diarrhea, unspecified: Secondary | ICD-10-CM | POA: Diagnosis not present

## 2016-09-30 DIAGNOSIS — A09 Infectious gastroenteritis and colitis, unspecified: Secondary | ICD-10-CM | POA: Diagnosis not present

## 2016-10-02 DIAGNOSIS — R634 Abnormal weight loss: Secondary | ICD-10-CM | POA: Diagnosis not present

## 2016-10-02 DIAGNOSIS — R1084 Generalized abdominal pain: Secondary | ICD-10-CM | POA: Diagnosis not present

## 2016-10-02 DIAGNOSIS — K5730 Diverticulosis of large intestine without perforation or abscess without bleeding: Secondary | ICD-10-CM | POA: Diagnosis not present

## 2016-10-02 DIAGNOSIS — R197 Diarrhea, unspecified: Secondary | ICD-10-CM | POA: Diagnosis not present

## 2016-10-06 DIAGNOSIS — I10 Essential (primary) hypertension: Secondary | ICD-10-CM | POA: Diagnosis not present

## 2016-10-06 DIAGNOSIS — E785 Hyperlipidemia, unspecified: Secondary | ICD-10-CM | POA: Diagnosis not present

## 2016-10-06 DIAGNOSIS — Z905 Acquired absence of kidney: Secondary | ICD-10-CM | POA: Diagnosis not present

## 2016-10-06 DIAGNOSIS — I2510 Atherosclerotic heart disease of native coronary artery without angina pectoris: Secondary | ICD-10-CM | POA: Diagnosis not present

## 2016-10-06 DIAGNOSIS — C61 Malignant neoplasm of prostate: Secondary | ICD-10-CM | POA: Diagnosis not present

## 2016-10-06 DIAGNOSIS — E559 Vitamin D deficiency, unspecified: Secondary | ICD-10-CM | POA: Diagnosis not present

## 2016-10-06 DIAGNOSIS — Z1389 Encounter for screening for other disorder: Secondary | ICD-10-CM | POA: Diagnosis not present

## 2016-10-06 DIAGNOSIS — E039 Hypothyroidism, unspecified: Secondary | ICD-10-CM | POA: Diagnosis not present

## 2016-10-06 DIAGNOSIS — I719 Aortic aneurysm of unspecified site, without rupture: Secondary | ICD-10-CM | POA: Diagnosis not present

## 2016-10-06 DIAGNOSIS — Z9181 History of falling: Secondary | ICD-10-CM | POA: Diagnosis not present

## 2016-10-06 DIAGNOSIS — N281 Cyst of kidney, acquired: Secondary | ICD-10-CM | POA: Diagnosis not present

## 2016-10-07 ENCOUNTER — Telehealth: Payer: Self-pay

## 2016-10-07 NOTE — Telephone Encounter (Signed)
L/m to call ofc to  [email or web address]

## 2016-10-13 ENCOUNTER — Ambulatory Visit: Payer: Self-pay | Admitting: Cardiology

## 2016-10-20 ENCOUNTER — Encounter: Payer: Self-pay | Admitting: Cardiology

## 2016-10-20 ENCOUNTER — Ambulatory Visit (INDEPENDENT_AMBULATORY_CARE_PROVIDER_SITE_OTHER): Payer: PRIVATE HEALTH INSURANCE | Admitting: Cardiology

## 2016-10-20 VITALS — BP 126/78 | HR 48 | Ht 66.0 in | Wt 146.8 lb

## 2016-10-20 DIAGNOSIS — I10 Essential (primary) hypertension: Secondary | ICD-10-CM | POA: Diagnosis not present

## 2016-10-20 DIAGNOSIS — N189 Chronic kidney disease, unspecified: Secondary | ICD-10-CM | POA: Diagnosis not present

## 2016-10-20 DIAGNOSIS — I714 Abdominal aortic aneurysm, without rupture, unspecified: Secondary | ICD-10-CM

## 2016-10-20 DIAGNOSIS — I723 Aneurysm of iliac artery: Secondary | ICD-10-CM

## 2016-10-20 DIAGNOSIS — E782 Mixed hyperlipidemia: Secondary | ICD-10-CM | POA: Diagnosis not present

## 2016-10-20 DIAGNOSIS — I2510 Atherosclerotic heart disease of native coronary artery without angina pectoris: Secondary | ICD-10-CM

## 2016-10-20 DIAGNOSIS — Z905 Acquired absence of kidney: Secondary | ICD-10-CM | POA: Diagnosis not present

## 2016-10-20 HISTORY — DX: Acquired absence of kidney: Z90.5

## 2016-10-20 HISTORY — DX: Chronic kidney disease, unspecified: N18.9

## 2016-10-20 NOTE — Patient Instructions (Signed)
Medication Instructions:  None  Labwork: None  Testing/Procedures: None  Follow-Up: 6 months  Any Other Special Instructions Will Be Listed Below (If Applicable).     If you need a refill on your cardiac medications before your next appointment, please call your pharmacy.

## 2016-10-20 NOTE — Progress Notes (Signed)
Cardiology Office Note:    Date:  10/20/2016   ID:  Dustin Vaughan, DOB 1932/12/22, MRN [id number]  PCP:  Nicholos Johns, MD  Cardiologist:  Jenean Lindau, MD   Referring MD: Nicholos Johns, MD    ASSESSMENT:    1. Coronary artery disease involving native coronary artery of native heart without angina pectoris   2. Abdominal aortic aneurysm (AAA) without rupture ([place])   3. Aneurysm of iliac artery (HCC)   4. Essential hypertension   5. Chronic kidney disease, unspecified CKD stage   6. Acquired absence of kidney   7. Mixed hyperlipidemia    PLAN:    In order of problems listed above:  1. Secondary prevention stressed to the patient. Importance of compliance with diet and medications stressed and he verbalized understanding. 2. His aortic aneurysm issues are followed by his vascular surgeon. 3.  Patient's pressure stable and we will continue to monitor this.I reviewed his records from primary care physician's office. 4. I will try to obtain a copy of this lab work including lipids. We will also obtain records from the other cardiology office. He was followed regularly. 5. Importance of regular exercise stressed. Patient will be seen in follow-up appointment in 6 months or earlier if he has any concerns.   Medication Adjustments/Labs and Tests Ordered: Current medicines are reviewed at length with the patient today.  Concerns regarding medicines are outlined above.  No orders of the defined types were placed in this encounter.  No orders of the defined types were placed in this encounter.    History of Present Illness:    Dustin Vaughan is a 83 y.o. male who is being seen today for the evaluation of coronary artery disease at the request of Nicholos Johns, MD. Patient is a pleasant 83 year old male. He has past medical history of coronary artery disease, dyslipidemia. He has aortic aneurysm followed by his vascular surgeon. He's 83 year old who is in excellent shape.He tells me  that he had a significant bout of diarrhea and lost weight. Subsequently he has done fine. No chest pain orthopnea or PND. I have taken care of him while he was a patient of in my previous practice. Now he wants his care transferred to this practice. At the time of my evaluation is alert awake oriented and in no distress.  Past Medical History:  Diagnosis Date  . Coronary artery disease   . Hyperlipidemia     History reviewed. No pertinent surgical history.  Current Medications: Current Meds  Medication Sig  . amLODipine (NORVASC) 10 MG tablet Take 10 mg by mouth daily.  Marland Kitchen aspirin EC 81 MG tablet Take 81 mg by mouth daily.  . calcium carbonate (CALCIUM 600) 1500 (600 Ca) MG TABS tablet Take 1 capsule by mouth daily.  . Coenzyme Q-10 200 MG CAPS Take 200 mg by mouth daily.  Marland Kitchen levothyroxine (SYNTHROID, LEVOTHROID) 25 MCG tablet Take 25 mcg by mouth daily.  . metoprolol succinate (TOPROL-XL) 50 MG 24 hr tablet Take 50 mg by mouth 2 (two) times daily.  . simvastatin (ZOCOR) 20 MG tablet Take 20 mg by mouth daily.  . Vitamin D, Ergocalciferol, (DRISDOL) 50000 units CAPS capsule Take 50,000 Units by mouth once a week.     Allergies:   Patient has no known allergies.   Social History   Social History  . Marital status: Married    Spouse name: N/A  . Number of children: N/A  . Years of education: N/A   Social  History Main Topics  . Smoking status: Current Every Day Smoker    Packs/day: 1.50    Types: Cigarettes  . Smokeless tobacco: Never Used  . Alcohol use Yes     Comment: occ  . Drug use: No  . Sexual activity: Not Asked   Other Topics Concern  . None   Social History Narrative  . None     Family History: The patient's family history includes Diabetes in his mother; Heart attack in his father; Heart disease in his father; Heart failure in his father; Hyperlipidemia in his father; Hypertension in his father; Stroke in his father.  ROS:   Please see the history of  present illness.    All other systems reviewed and are negative.  EKGs/Labs/Other Studies Reviewed:    The following studies were reviewed today: I reviewed previous office records extensively. Notes from his primary care physician office records were reviewed and were requesting cardiology records from the previous cardiology practice that he was established with.   Recent Labs: No results found for requested labs within last 8760 hours.  Recent Lipid Panel No results found for: CHOL, TRIG, HDL, CHOLHDL, VLDL, LDLCALC, LDLDIRECT  Physical Exam:    VS:  BP 126/78   Pulse (!) 48   Ht 5\' 6"  (1.676 m)   Wt 146 lb 12.8 oz (66.6 kg)   SpO2 97%   BMI 23.69 kg/m     Wt Readings from Last 3 Encounters:  10/20/16 146 lb 12.8 oz (66.6 kg)     GEN: Patient is in no acute distress HEENT: Normal NECK: No JVD; No carotid bruits LYMPHATICS: No lymphadenopathy CARDIAC: S1 S2 regular, 2/6 systolic murmur at the apex. RESPIRATORY:  Clear to auscultation without rales, wheezing or rhonchi  ABDOMEN: Soft, non-tender, non-distended MUSCULOSKELETAL:  No edema; No deformity  SKIN: Warm and dry NEUROLOGIC:  Alert and oriented x 3 PSYCHIATRIC:  Normal affect    Signed, Jenean Lindau, MD  10/20/2016 10:46 AM    [place]

## 2017-02-03 DIAGNOSIS — I129 Hypertensive chronic kidney disease with stage 1 through stage 4 chronic kidney disease, or unspecified chronic kidney disease: Secondary | ICD-10-CM | POA: Diagnosis not present

## 2017-02-03 DIAGNOSIS — N189 Chronic kidney disease, unspecified: Secondary | ICD-10-CM | POA: Diagnosis not present

## 2017-02-03 DIAGNOSIS — N183 Chronic kidney disease, stage 3 (moderate): Secondary | ICD-10-CM | POA: Diagnosis not present

## 2017-02-03 DIAGNOSIS — N2581 Secondary hyperparathyroidism of renal origin: Secondary | ICD-10-CM | POA: Diagnosis not present

## 2017-02-03 DIAGNOSIS — D631 Anemia in chronic kidney disease: Secondary | ICD-10-CM | POA: Diagnosis not present

## 2017-02-16 DIAGNOSIS — Z79899 Other long term (current) drug therapy: Secondary | ICD-10-CM | POA: Diagnosis not present

## 2017-02-16 DIAGNOSIS — E785 Hyperlipidemia, unspecified: Secondary | ICD-10-CM | POA: Diagnosis not present

## 2017-02-16 DIAGNOSIS — Z6825 Body mass index (BMI) 25.0-25.9, adult: Secondary | ICD-10-CM | POA: Diagnosis not present

## 2017-02-16 DIAGNOSIS — E559 Vitamin D deficiency, unspecified: Secondary | ICD-10-CM | POA: Diagnosis not present

## 2017-02-16 DIAGNOSIS — J309 Allergic rhinitis, unspecified: Secondary | ICD-10-CM | POA: Diagnosis not present

## 2017-02-16 DIAGNOSIS — Z905 Acquired absence of kidney: Secondary | ICD-10-CM | POA: Diagnosis not present

## 2017-02-16 DIAGNOSIS — Z87898 Personal history of other specified conditions: Secondary | ICD-10-CM | POA: Diagnosis not present

## 2017-02-16 DIAGNOSIS — E663 Overweight: Secondary | ICD-10-CM | POA: Diagnosis not present

## 2017-02-16 DIAGNOSIS — E039 Hypothyroidism, unspecified: Secondary | ICD-10-CM | POA: Diagnosis not present

## 2017-02-16 DIAGNOSIS — I10 Essential (primary) hypertension: Secondary | ICD-10-CM | POA: Diagnosis not present

## 2017-02-16 DIAGNOSIS — N189 Chronic kidney disease, unspecified: Secondary | ICD-10-CM | POA: Diagnosis not present

## 2017-02-26 HISTORY — PX: ANGIOPLASTY: SHX39

## 2017-03-17 DIAGNOSIS — E663 Overweight: Secondary | ICD-10-CM | POA: Diagnosis not present

## 2017-03-17 DIAGNOSIS — J019 Acute sinusitis, unspecified: Secondary | ICD-10-CM | POA: Diagnosis not present

## 2017-03-17 DIAGNOSIS — I10 Essential (primary) hypertension: Secondary | ICD-10-CM | POA: Diagnosis not present

## 2017-03-17 DIAGNOSIS — Z6825 Body mass index (BMI) 25.0-25.9, adult: Secondary | ICD-10-CM | POA: Diagnosis not present

## 2017-03-17 DIAGNOSIS — E559 Vitamin D deficiency, unspecified: Secondary | ICD-10-CM | POA: Diagnosis not present

## 2017-03-17 DIAGNOSIS — Z79899 Other long term (current) drug therapy: Secondary | ICD-10-CM | POA: Diagnosis not present

## 2017-03-17 DIAGNOSIS — N189 Chronic kidney disease, unspecified: Secondary | ICD-10-CM | POA: Diagnosis not present

## 2017-03-18 DIAGNOSIS — I714 Abdominal aortic aneurysm, without rupture: Secondary | ICD-10-CM | POA: Diagnosis not present

## 2017-03-31 DIAGNOSIS — Z23 Encounter for immunization: Secondary | ICD-10-CM | POA: Diagnosis not present

## 2017-05-19 DIAGNOSIS — E663 Overweight: Secondary | ICD-10-CM | POA: Diagnosis not present

## 2017-05-19 DIAGNOSIS — E559 Vitamin D deficiency, unspecified: Secondary | ICD-10-CM | POA: Diagnosis not present

## 2017-05-19 DIAGNOSIS — E785 Hyperlipidemia, unspecified: Secondary | ICD-10-CM | POA: Diagnosis not present

## 2017-05-19 DIAGNOSIS — E039 Hypothyroidism, unspecified: Secondary | ICD-10-CM | POA: Diagnosis not present

## 2017-05-19 DIAGNOSIS — Z6825 Body mass index (BMI) 25.0-25.9, adult: Secondary | ICD-10-CM | POA: Diagnosis not present

## 2017-05-19 DIAGNOSIS — Z79899 Other long term (current) drug therapy: Secondary | ICD-10-CM | POA: Diagnosis not present

## 2017-05-19 DIAGNOSIS — I10 Essential (primary) hypertension: Secondary | ICD-10-CM | POA: Diagnosis not present

## 2017-06-18 DIAGNOSIS — E785 Hyperlipidemia, unspecified: Secondary | ICD-10-CM | POA: Diagnosis not present

## 2017-06-18 DIAGNOSIS — Z1331 Encounter for screening for depression: Secondary | ICD-10-CM | POA: Diagnosis not present

## 2017-06-18 DIAGNOSIS — Z9181 History of falling: Secondary | ICD-10-CM | POA: Diagnosis not present

## 2017-06-18 DIAGNOSIS — Z125 Encounter for screening for malignant neoplasm of prostate: Secondary | ICD-10-CM | POA: Diagnosis not present

## 2017-06-18 DIAGNOSIS — Z0000 Encounter for general adult medical examination without abnormal findings: Secondary | ICD-10-CM | POA: Diagnosis not present

## 2017-08-19 DIAGNOSIS — H25811 Combined forms of age-related cataract, right eye: Secondary | ICD-10-CM | POA: Diagnosis not present

## 2017-08-19 DIAGNOSIS — H2512 Age-related nuclear cataract, left eye: Secondary | ICD-10-CM | POA: Diagnosis not present

## 2017-08-19 DIAGNOSIS — H524 Presbyopia: Secondary | ICD-10-CM | POA: Diagnosis not present

## 2017-09-22 DIAGNOSIS — N183 Chronic kidney disease, stage 3 (moderate): Secondary | ICD-10-CM | POA: Diagnosis not present

## 2017-09-22 DIAGNOSIS — E559 Vitamin D deficiency, unspecified: Secondary | ICD-10-CM | POA: Diagnosis not present

## 2017-09-22 DIAGNOSIS — I10 Essential (primary) hypertension: Secondary | ICD-10-CM | POA: Diagnosis not present

## 2017-09-22 DIAGNOSIS — N189 Chronic kidney disease, unspecified: Secondary | ICD-10-CM | POA: Diagnosis not present

## 2017-09-22 DIAGNOSIS — Z79899 Other long term (current) drug therapy: Secondary | ICD-10-CM | POA: Diagnosis not present

## 2017-09-22 DIAGNOSIS — E039 Hypothyroidism, unspecified: Secondary | ICD-10-CM | POA: Diagnosis not present

## 2017-09-22 DIAGNOSIS — Z1339 Encounter for screening examination for other mental health and behavioral disorders: Secondary | ICD-10-CM | POA: Diagnosis not present

## 2017-09-22 DIAGNOSIS — Z6823 Body mass index (BMI) 23.0-23.9, adult: Secondary | ICD-10-CM | POA: Diagnosis not present

## 2017-09-22 DIAGNOSIS — E785 Hyperlipidemia, unspecified: Secondary | ICD-10-CM | POA: Diagnosis not present

## 2017-09-22 DIAGNOSIS — E663 Overweight: Secondary | ICD-10-CM | POA: Diagnosis not present

## 2017-09-23 DIAGNOSIS — R634 Abnormal weight loss: Secondary | ICD-10-CM | POA: Diagnosis not present

## 2017-10-13 ENCOUNTER — Ambulatory Visit: Payer: Medicare Other | Admitting: Cardiology

## 2017-10-15 DIAGNOSIS — I129 Hypertensive chronic kidney disease with stage 1 through stage 4 chronic kidney disease, or unspecified chronic kidney disease: Secondary | ICD-10-CM | POA: Diagnosis not present

## 2017-10-15 DIAGNOSIS — N2581 Secondary hyperparathyroidism of renal origin: Secondary | ICD-10-CM | POA: Diagnosis not present

## 2017-10-15 DIAGNOSIS — D631 Anemia in chronic kidney disease: Secondary | ICD-10-CM | POA: Diagnosis not present

## 2017-10-15 DIAGNOSIS — N183 Chronic kidney disease, stage 3 (moderate): Secondary | ICD-10-CM | POA: Diagnosis not present

## 2017-10-22 ENCOUNTER — Ambulatory Visit (INDEPENDENT_AMBULATORY_CARE_PROVIDER_SITE_OTHER): Payer: Medicare Other | Admitting: Cardiology

## 2017-10-22 ENCOUNTER — Encounter: Payer: Self-pay | Admitting: Cardiology

## 2017-10-22 VITALS — BP 104/68 | HR 53 | Ht 66.0 in | Wt 134.0 lb

## 2017-10-22 DIAGNOSIS — I2510 Atherosclerotic heart disease of native coronary artery without angina pectoris: Secondary | ICD-10-CM

## 2017-10-22 DIAGNOSIS — C649 Malignant neoplasm of unspecified kidney, except renal pelvis: Secondary | ICD-10-CM

## 2017-10-22 DIAGNOSIS — N281 Cyst of kidney, acquired: Secondary | ICD-10-CM | POA: Insufficient documentation

## 2017-10-22 DIAGNOSIS — F17200 Nicotine dependence, unspecified, uncomplicated: Secondary | ICD-10-CM | POA: Diagnosis not present

## 2017-10-22 DIAGNOSIS — I10 Essential (primary) hypertension: Secondary | ICD-10-CM

## 2017-10-22 DIAGNOSIS — E782 Mixed hyperlipidemia: Secondary | ICD-10-CM | POA: Diagnosis not present

## 2017-10-22 DIAGNOSIS — I714 Abdominal aortic aneurysm, without rupture, unspecified: Secondary | ICD-10-CM

## 2017-10-22 HISTORY — DX: Malignant neoplasm of unspecified kidney, except renal pelvis: C64.9

## 2017-10-22 HISTORY — DX: Cyst of kidney, acquired: N28.1

## 2017-10-22 NOTE — Progress Notes (Signed)
Cardiology Office Note:    Date:  10/22/2017   ID:  Dustin Vaughan, DOB 16-Mar-1932, MRN [id number]  PCP:  Nicholos Johns, MD  Cardiologist:  Jenean Lindau, MD   Referring MD: Nicholos Johns, MD    ASSESSMENT:    1. Abdominal aortic aneurysm (AAA) without rupture ([place])   2. Coronary artery disease involving native coronary artery of native heart without angina pectoris   3. Essential hypertension   4. Mixed hyperlipidemia   5. Smoker    PLAN:    In order of problems listed above:  1. Secondary prevention stressed with the patient.  Importance of compliance with diet and medication stressed and he vocalized understanding.  His blood pressure is stable.  He does see a nephrologist for his ongoing significant renal issues. 2. Diet was discussed with dyslipidemia.  His lipids are fine. 3. He wants [place] to follow his abdominal aortic aneurysm issues.  He has had ultrasounds done earlier this year with his vascular doctor we will try to obtain those records and reviewed them into the needful. 4. Patient will be seen in follow-up appointment in 6 months or earlier if the patient has any concerns    Medication Adjustments/Labs and Tests Ordered: Current medicines are reviewed at length with the patient today.  Concerns regarding medicines are outlined above.  No orders of the defined types were placed in this encounter.  No orders of the defined types were placed in this encounter.    No chief complaint on file.    History of Present Illness:    Dustin Vaughan is a 84 y.o. male.  Patient has history of atherosclerotic vascular disease and abdominal aortic aneurysm. He has history of essential hypertension, dyslipidemia and renal insufficiency.  He denies any problems at this time and takes care of activities of daily living.  No chest pain orthopnea or PND.  He has been recovering from a respiratory tract infection.  Past Medical History:  Diagnosis Date  . Coronary artery disease    . Hyperlipidemia     Past Surgical History:  Procedure Laterality Date  . KIDNEY SURGERY      Current Medications: Current Meds  Medication Sig  . amLODipine (NORVASC) 10 MG tablet Take 10 mg by mouth daily.  Marland Kitchen aspirin EC 81 MG tablet Take 81 mg by mouth daily.  . calcium carbonate (CALCIUM 600) 1500 (600 Ca) MG TABS tablet Take 1 capsule by mouth daily.  . Coenzyme Q-10 200 MG CAPS Take 200 mg by mouth daily.  Marland Kitchen levothyroxine (SYNTHROID, LEVOTHROID) 25 MCG tablet Take 25 mcg by mouth daily.  . metoprolol succinate (TOPROL-XL) 50 MG 24 hr tablet Take 50 mg by mouth 2 (two) times daily.  . simvastatin (ZOCOR) 20 MG tablet Take 20 mg by mouth daily.  . Vitamin D, Ergocalciferol, (DRISDOL) 50000 units CAPS capsule Take 50,000 Units by mouth once a week.     Allergies:   Patient has no known allergies.   Social History   Socioeconomic History  . Marital status: Married    Spouse name: Not on file  . Number of children: Not on file  . Years of education: Not on file  . Highest education level: Not on file  Occupational History  . Not on file  Social Needs  . Financial resource strain: Not on file  . Food insecurity:    Worry: Not on file    Inability: Not on file  . Transportation needs:    Medical: Not  on file    Non-medical: Not on file  Tobacco Use  . Smoking status: Current Every Day Smoker    Packs/day: 1.50    Types: Cigarettes  . Smokeless tobacco: Never Used  Substance and Sexual Activity  . Alcohol use: Yes    Comment: occ  . Drug use: No  . Sexual activity: Not on file  Lifestyle  . Physical activity:    Days per week: Not on file    Minutes per session: Not on file  . Stress: Not on file  Relationships  . Social connections:    Talks on phone: Not on file    Gets together: Not on file    Attends religious service: Not on file    Active member of club or organization: Not on file    Attends meetings of clubs or organizations: Not on file     Relationship status: Not on file  Other Topics Concern  . Not on file  Social History Narrative  . Not on file     Family History: The patient's family history includes Diabetes in his mother; Heart attack in his father; Heart disease in his father; Heart failure in his father; Hyperlipidemia in his father; Hypertension in his father; Stroke in his father.  ROS:   Please see the history of present illness.    All other systems reviewed and are negative.  EKGs/Labs/Other Studies Reviewed:    The following studies were reviewed today: I reviewed records with patient.  I included in the discussion of the recent lab work that he had with his primary care physician.   Recent Labs: No results found for requested labs within last 8760 hours.  Recent Lipid Panel No results found for: CHOL, TRIG, HDL, CHOLHDL, VLDL, LDLCALC, LDLDIRECT  Physical Exam:    VS:  BP 104/68 (BP Location: Right Arm, Patient Position: Sitting, Cuff Size: Normal)   Pulse (!) 53   Ht 5\' 6"  (1.676 m)   Wt 134 lb (60.8 kg)   SpO2 95%   BMI 21.63 kg/m     Wt Readings from Last 3 Encounters:  10/22/17 134 lb (60.8 kg)  10/20/16 146 lb 12.8 oz (66.6 kg)     GEN: Patient is in no acute distress HEENT: Normal NECK: No JVD; No carotid bruits LYMPHATICS: No lymphadenopathy CARDIAC: Hear sounds regular, 2/6 systolic murmur at the apex. RESPIRATORY:  Clear to auscultation without rales, wheezing or rhonchi  ABDOMEN: Soft, non-tender, non-distended MUSCULOSKELETAL:  No edema; No deformity  SKIN: Warm and dry NEUROLOGIC:  Alert and oriented x 3 PSYCHIATRIC:  Normal affect   Signed, Jenean Lindau, MD  10/22/2017 10:17 AM    [place]

## 2017-10-22 NOTE — Patient Instructions (Signed)

## 2017-10-25 DIAGNOSIS — R634 Abnormal weight loss: Secondary | ICD-10-CM | POA: Diagnosis not present

## 2017-10-25 DIAGNOSIS — Z79899 Other long term (current) drug therapy: Secondary | ICD-10-CM | POA: Diagnosis not present

## 2017-10-25 DIAGNOSIS — R05 Cough: Secondary | ICD-10-CM | POA: Diagnosis not present

## 2017-10-25 DIAGNOSIS — R195 Other fecal abnormalities: Secondary | ICD-10-CM | POA: Diagnosis not present

## 2017-10-25 DIAGNOSIS — E039 Hypothyroidism, unspecified: Secondary | ICD-10-CM | POA: Diagnosis not present

## 2017-10-25 DIAGNOSIS — I719 Aortic aneurysm of unspecified site, without rupture: Secondary | ICD-10-CM | POA: Diagnosis not present

## 2017-10-26 ENCOUNTER — Encounter: Payer: Self-pay | Admitting: Gastroenterology

## 2017-11-01 DIAGNOSIS — N289 Disorder of kidney and ureter, unspecified: Secondary | ICD-10-CM | POA: Diagnosis not present

## 2017-11-02 ENCOUNTER — Encounter: Payer: Self-pay | Admitting: Gastroenterology

## 2017-11-03 ENCOUNTER — Ambulatory Visit (INDEPENDENT_AMBULATORY_CARE_PROVIDER_SITE_OTHER): Payer: Medicare Other | Admitting: Gastroenterology

## 2017-11-03 ENCOUNTER — Encounter: Payer: Self-pay | Admitting: Gastroenterology

## 2017-11-03 VITALS — BP 140/66 | HR 50 | Ht 66.0 in | Wt 144.1 lb

## 2017-11-03 DIAGNOSIS — Z86010 Personal history of colonic polyps: Secondary | ICD-10-CM | POA: Diagnosis not present

## 2017-11-03 DIAGNOSIS — R197 Diarrhea, unspecified: Secondary | ICD-10-CM

## 2017-11-03 DIAGNOSIS — R634 Abnormal weight loss: Secondary | ICD-10-CM

## 2017-11-03 NOTE — Progress Notes (Addendum)
Chief Complaint: History of weight loss  Referring Provider:  Nicholos Johns, MD      ASSESSMENT AND PLAN;   #1.  Diarrhea (resolved) #2.  Weight loss (resolved),  #3.  History of colonic polyps/family history of colon cancer-daughter at age below 24.  Plan: -Patient is doing great from GI standpoint.  He would like to hold off on any endoscopic procedures currently.  He does understand that there is small but definite risks of missing colorectal neoplasms. -He is to call us if he starts having any recurrent GI problems.  He was given all the contact numbers.  He also has my cell phone contact No as well.   Addendum: CT 09/2016: Colonic diverticulosis, right nephrectomy, AAA 5.4 cm (being followed by Dr Geraldo Pitter), DJD HPI:    Kameren Pargas is a 84 y.o. male  Seen at request of Dr. Rica Records Who made appointment several weeks ago due to diarrhea and weight loss. About 2 weeks ago, he was found to have pneumonia on chest x-ray and was given antibiotics with complete resolution of diarrhea. His appetite has improved He is gaining weight. Denies having any GI problems currently Was going to cancel the appointment.  He also wanted to get established, hence he came. Has been playing golf every day now.-He is a Engineer, manufacturing and teaches golf as well. Has history of colonic polyps on colonoscopy 11/2011-1 cm a sending colon polyp status post polypectomy, moderate predominantly sigmoid diverticulosis, mild radiation-induced chronic changes in the rectum.  He was supposed to have repeat colonoscopy done in 2016-but wanted to hold off due to her daughter's illness.   Past Medical History:  Diagnosis Date  . Aneurysm, aortic ([place])   . Cancer of kidney ([place]) 1997   right removed   . COPD (chronic obstructive pulmonary disease) ([place])   . Coronary artery disease   . Hemorrhoids, external without complications   . High blood pressure   . Hypercholesteremia   . Hyperlipidemia    . Osteoarthritis   . Pneumonia   . Prostate cancer [facility])     Past Surgical History:  Procedure Laterality Date  . ANGIOPLASTY  02/26/2017  . COLONOSCOPY  12/09/2011   Colonic polyp status post polypectomy. [place] predominantly sigmoid diverticulosis. Mid radiationinduced chronic changes in the rectum. Otherwise normal colonoscopy. Tubular adenoma.   Marland Kitchen KIDNEY SURGERY Right 1997   only have 1 kidney. Left pt has     Family History  Problem Relation Age of Onset  . Diabetes Mother   . Heart disease Father   . Heart failure Father   . Hyperlipidemia Father   . Hypertension Father   . Heart attack Father   . Stroke Father     Social History   Tobacco Use  . Smoking status: Current Every Day Smoker    Types: Cigarettes  . Smokeless tobacco: Never Used  Substance Use Topics  . Alcohol use: Not Currently    Comment: quit over a year ago  . Drug use: No    Current Outpatient Medications  Medication Sig Dispense Refill  . amLODipine (NORVASC) 10 MG tablet Take 10 mg by mouth daily.    Marland Kitchen aspirin EC 81 MG tablet Take 81 mg by mouth daily.    . calcium carbonate (CALCIUM 600) 1500 (600 Ca) MG TABS tablet Take 1 capsule by mouth daily.    . Coenzyme Q-10 200 MG CAPS Take 200 mg by mouth daily.    . fluticasone (FLONASE)  50 MCG/ACT nasal spray Place 2 sprays into both nostrils daily.  0  . levothyroxine (SYNTHROID, LEVOTHROID) 25 MCG tablet Take 25 mcg by mouth daily.    . metoprolol succinate (TOPROL-XL) 50 MG 24 hr tablet Take 50 mg by mouth 2 (two) times daily.    . montelukast (SINGULAIR) 10 MG tablet TAKE ONE TABLET BY MOUTH DAILY AT BEDTIME FOR allergies  0  . simvastatin (ZOCOR) 20 MG tablet Take 20 mg by mouth daily.    . Vitamin D, Ergocalciferol, (DRISDOL) 50000 units CAPS capsule Take 50,000 Units by mouth once a week.     No current facility-administered medications for this visit.     No Known Allergies  Review of Systems:  Constitutional: Denies fever,  chills, diaphoresis, appetite change and fatigue.  HEENT: Denies photophobia, eye pain, redness, hearing loss, ear pain, congestion, sore throat, rhinorrhea, sneezing, mouth sores, neck pain, neck stiffness and tinnitus.   Respiratory: Denies SOB, DOE, cough, chest tightness,  and wheezing.   Cardiovascular: Denies chest pain, palpitations and leg swelling.  Genitourinary: Denies dysuria, urgency, frequency, hematuria, flank pain and difficulty urinating.  Musculoskeletal: Denies myalgias, back pain, joint swelling, arthralgias and gait problem.  Skin: No rash.  Neurological: Denies dizziness, seizures, syncope, weakness, light-headedness, numbness and headaches.  Hematological: Denies adenopathy. Easy bruising, personal or family bleeding history  Psychiatric/Behavioral: No anxiety or depression     Physical Exam:    BP 140/66   Pulse (!) 50   Ht 5\' 6"  (1.676 m)   Wt 144 lb 2 oz (65.4 kg)   BMI 23.26 kg/m  Filed Weights   11/03/17 0950  Weight: 144 lb 2 oz (65.4 kg)   Constitutional:  Well-developed, in no acute distress. Psychiatric: Normal mood and affect. Behavior is normal. HEENT: Pupils normal.  Conjunctivae are normal. No scleral icterus. Neck supple.  Cardiovascular: Normal rate, regular rhythm. No edema Pulmonary/chest: Effort normal and breath sounds normal. No wheezing, rales or rhonchi. Abdominal: Soft, nondistended. Nontender. Bowel sounds active throughout. There are no masses palpable. No hepatomegaly. Rectal:  defered Neurological: Alert and oriented to person place and time. Skin: Skin is warm and dry. No rashes noted.    Carmell Austria, MD 11/03/2017, 10:14 AM  Cc: Nicholos Johns, MD

## 2017-11-03 NOTE — Patient Instructions (Signed)
If you are age 82 or older, your body mass index should be between 23-30. Your Body mass index is 23.26 kg/m. If this is out of the aforementioned range listed, please consider follow up with your Primary Care Provider.  If you are age 71 or younger, your body mass index should be between 19-25. Your Body mass index is 23.26 kg/m. If this is out of the aformentioned range listed, please consider follow up with your Primary Care Provider.   Thank you,  Dr. Jackquline Denmark

## 2017-11-04 DIAGNOSIS — J309 Allergic rhinitis, unspecified: Secondary | ICD-10-CM | POA: Diagnosis not present

## 2017-11-04 DIAGNOSIS — I10 Essential (primary) hypertension: Secondary | ICD-10-CM | POA: Diagnosis not present

## 2017-11-04 DIAGNOSIS — Z23 Encounter for immunization: Secondary | ICD-10-CM | POA: Diagnosis not present

## 2017-11-04 DIAGNOSIS — Z6823 Body mass index (BMI) 23.0-23.9, adult: Secondary | ICD-10-CM | POA: Diagnosis not present

## 2017-11-04 DIAGNOSIS — R634 Abnormal weight loss: Secondary | ICD-10-CM | POA: Diagnosis not present

## 2018-01-24 ENCOUNTER — Telehealth: Payer: Self-pay | Admitting: Cardiology

## 2018-01-24 NOTE — Telephone Encounter (Signed)
Has questions about a vascular study he was supposed to have done months ago

## 2018-02-01 NOTE — Telephone Encounter (Signed)
Tried calling patient twice couldn't leave voicemail will continue to call

## 2018-04-12 ENCOUNTER — Telehealth: Payer: Self-pay

## 2018-04-13 ENCOUNTER — Encounter: Payer: Self-pay | Admitting: Cardiology

## 2018-04-13 ENCOUNTER — Ambulatory Visit (INDEPENDENT_AMBULATORY_CARE_PROVIDER_SITE_OTHER): Payer: Medicare Other | Admitting: Cardiology

## 2018-04-13 ENCOUNTER — Other Ambulatory Visit: Payer: Self-pay

## 2018-04-13 VITALS — BP 118/62 | HR 47 | Ht 66.0 in | Wt 141.0 lb

## 2018-04-13 DIAGNOSIS — I714 Abdominal aortic aneurysm, without rupture, unspecified: Secondary | ICD-10-CM

## 2018-04-13 DIAGNOSIS — E782 Mixed hyperlipidemia: Secondary | ICD-10-CM | POA: Diagnosis not present

## 2018-04-13 DIAGNOSIS — Z1329 Encounter for screening for other suspected endocrine disorder: Secondary | ICD-10-CM | POA: Diagnosis not present

## 2018-04-13 DIAGNOSIS — Z79899 Other long term (current) drug therapy: Secondary | ICD-10-CM

## 2018-04-13 NOTE — Patient Instructions (Signed)
Medication Instructions:  Your physician recommends that you continue on your current medications as directed. Please refer to the Current Medication list given to you today.  If you need a refill on your cardiac medications before your next appointment, please call your pharmacy.   Lab work: Your physician recommends that you have a BMP, CBC, TSH, LFT and lipid panel drawn.  If you have labs (blood work) drawn today and your tests are completely normal, you will receive your results only by:  [place] (if you have MyChart) OR  A paper copy in the mail If you have any lab test that is abnormal or we need to change your treatment, we will call you to review the results.  Testing/Procedures: An EKG was performed today.  Your physician has requested that you have an abdominal aorta duplex. During this test, an ultrasound is used to evaluate the aorta. Allow 30 minutes for this exam. Do not eat after midnight the day before and avoid carbonated beverages   Follow-Up: At [facility], you and your health needs are our priority.  As part of our continuing mission to provide you with exceptional heart care, we have created designated Provider Care Teams.  These Care Teams include your primary Cardiologist (physician) and Advanced Practice Providers (APPs -  Physician Assistants and Nurse Practitioners) who all work together to provide you with the care you need, when you need it. You will need a follow up appointment in 6 months.    Any Other Special Instructions Will Be Listed Below  Abdominal Aortic Aneurysm  An aneurysm is a bulge in an artery. It happens when blood pushes against a weakened or damaged artery wall. An abdominal aortic aneurysm (AAA) is an aneurysm that occurs in the lower part of the aorta, which is the main artery of the body. The aorta supplies blood from the heart to the rest of the body. Some aneurysms may not cause symptoms. However, an AAA can cause two  serious problems:  It can enlarge and burst (rupture).  It can cause blood to flow between the layers of the wall of the aorta through a tear (aortic dissection). Both of these problems are medical emergencies. They can cause bleeding inside the body. If they are not diagnosed and treated right away, they can be life-threatening. What are the causes? The exact cause of this condition is not known. What increases the risk? The following factors may make you more likely to develop this condition:  Being a male 17 years of age or older.  Being Caucasian.  Using tobacco or having a history of tobacco use.  Having a family history of aneurysms.  Having any of the following conditions: ? Hardening of the arteries (arteriosclerosis). ? Inflammation of the walls of an artery (arteritis). ? Certain genetic conditions. ? Obesity. ? An infection in the wall of the aorta (infectious aortitis) caused by bacteria. ? High cholesterol. ? High blood pressure (hypertension). What are the signs or symptoms? Symptoms of this condition vary depending on the size of the aneurysm and how fast it is growing. Most aneurysms grow slowly and do not cause any symptoms. When symptoms do occur, they may include:  Pain in the abdomen, side, or lower back.  Feeling full after eating only small amounts of food.  Feeling a pulsating lump in the abdomen. Symptoms that the aneurysm has ruptured include:  A sudden onset of severe pain in the abdomen, side, or back.  Nausea or vomiting.  Feeling light-headed or passing out. How is this diagnosed? This condition may be diagnosed with:  A physical exam to check for throbbing and to listen to blood flow in your abdomen.  Tests, such as: ? Ultrasound. ? X-rays. ? CT scan. ? MRI. ? Tests to check your arteries for damage or blockage (angiogram). Because most unruptured AAAs cause no symptoms, they are often found during exams for other conditions. How is  this treated? Treatment for this condition depends on:  The size of the aneurysm.  How fast the aneurysm is growing.  Your age.  Risk factors for rupture. If your aneurysm is smaller than 2 inches (5 cm), your health care provider may manage it by:  Checking (monitoring) it regularly to see if it is getting bigger. Depending on the size of the aneurysm, how fast it is growing, and your other risk factors, you may have an ultrasound to monitor it every 3-6 months, every year, or every few years.  Giving you medicines to control blood pressure, treat pain, or fight infection. If your aneurysm is larger than 2 inches (5 cm), your health care provider may do surgery to repair it. Follow these instructions at home: Lifestyle  Do not use any products that contain nicotine or tobacco, such as cigarettes, e-cigarettes, and chewing tobacco. If you need help quitting, ask your health care provider.  Stay physically active and exercise regularly. Talk with your health care provider about how often you should exercise and which types of exercise are safe for you. Eating and drinking  Eat a heart-healthy diet. This includes plenty of fresh fruits and vegetables, whole grains, low-fat (lean) protein, and low-fat dairy products.  Avoid foods that are high in saturated fat and cholesterol, such as red meat and some dairy products.  Do not drink alcohol if: ? Your health care provider tells you not to drink. ? You are pregnant, may be pregnant, or are planning to become pregnant.  If you drink alcohol: ? Limit how much you use to:  0-1 drink a day for women.  0-2 drinks a day for men. ? Be aware of how much alcohol is in your drink. In the [place] one drink equals one typical bottle of beer (12 oz), one-half glass of wine (5 oz), or one shot of hard liquor (1 oz). General instructions  Take over-the-counter and prescription medicines only as told by your health care provider.  Keep your  blood pressure within a normal range. Check it regularly, and ask your health care provider what your target blood pressure should be.  Have your blood sugar (glucose) level and cholesterol levels checked regularly. Follow your health care provider's instructions on how to keep levels within normal limits.  Avoid heavy lifting and activities that take a lot of effort (are strenuous). Ask your health care provider what activities are safe for you.  Keep all follow-up visits as told by your health care provider. This is important. Contact a health care provider if you:  Have pain in your abdomen, side, or back.  Have a throbbing feeling in your abdomen. Get help right away if you:  Have sudden, severe pain in your abdomen, side, or back.  Experience nausea or vomiting.  Have constipation or problems urinating.  Feel light-headed.  Have a rapid heart rate when you stand.  Have sweaty, clammy skin.  Have shortness of breath.  Have a fever. These symptoms may represent a serious problem that is an emergency. Do not wait  to see if the symptoms will go away. Get medical help right away. Call your local emergency services (911 in the [place] Do not drive yourself to the hospital. Summary  An aneurysm is a bulge in an artery. It happens when blood pushes against a weakened or damaged artery wall.  Being older, male, Caucasian, having a history of tobacco use, and a family history of aneurysms can increase the risk.  These problems can cause bleeding inside the body and can be life-threatening. Get medical help right away. This information is not intended to replace advice given to you by your health care provider. Make sure you discuss any questions you have with your health care provider. Document Released: 10/22/2004 Document Revised: 08/21/2017 Document Reviewed: 08/21/2017 Elsevier Interactive Patient Education  2019 [employer].

## 2018-04-13 NOTE — Progress Notes (Signed)
Cardiology Office Note:    Date:  04/13/2018   ID:  Dustin Vaughan, DOB [birth year], MRN [id number]  PCP:  Nicholos Johns, MD  Cardiologist:  Jenean Lindau, MD   Referring MD: Nicholos Johns, MD    ASSESSMENT:    1. Abdominal aortic aneurysm (AAA) without rupture ([place])   2. Thyroid disorder screening   3. Medication therapy continued   4. Mixed hyperlipidemia    PLAN:    In order of problems listed above:  1. Primary prevention stressed with the patient.  Importance of compliance with diet and medication stressed.  Importance of regular exercise stressed.  His blood pressure is stable.  Diet was discussed for dyslipidemia and we will check his lab work today. 2. He requests that we follow-up his abdominal aortic aneurysm and we will set it up.  We will try to get records from the other place where he had all the studies done.  He is agreeable. 3. Patient will be seen in follow-up appointment in 6 months or earlier if the patient has any concerns    Medication Adjustments/Labs and Tests Ordered: Current medicines are reviewed at length with the patient today.  Concerns regarding medicines are outlined above.  Orders Placed This Encounter  Procedures  . TSH  . CBC  . Hepatic function panel  . Basic Metabolic Panel (BMET)  . Lipid Profile   No orders of the defined types were placed in this encounter.    No chief complaint on file.    History of Present Illness:    Dustin Vaughan is a 85 y.o. male.  Patient has history of essential hypertension and abdominal aortic aneurysm.  He mentions to me that his doctor was following his aneurysm but has moved is to check it.  He also has dyslipidemia.  He denies any problems at this time and takes care of activities of daily living.  No chest pain orthopnea or PND.  He has chronic diarrhea followed by his gastroenterologist.  At the time of my evaluation, the patient is alert awake oriented and in no distress.  Past Medical  History:  Diagnosis Date  . Aneurysm, aortic ([place])   . Cancer of kidney ([place]) 1997   right removed   . COPD (chronic obstructive pulmonary disease) ([place])   . Coronary artery disease   . Hemorrhoids, external without complications   . High blood pressure   . Hypercholesteremia   . Hyperlipidemia   . Osteoarthritis   . Pneumonia   . Prostate cancer [facility])     Past Surgical History:  Procedure Laterality Date  . ANGIOPLASTY  02/26/2017  . COLONOSCOPY  12/09/2011   Colonic polyp status post polypectomy. [place] predominantly sigmoid diverticulosis. Mid radiationinduced chronic changes in the rectum. Otherwise normal colonoscopy. Tubular adenoma.   Marland Kitchen KIDNEY SURGERY Right 1997   only have 1 kidney. Left pt has     Current Medications: Current Meds  Medication Sig  . amLODipine (NORVASC) 10 MG tablet Take 10 mg by mouth daily.  Marland Kitchen aspirin EC 81 MG tablet Take 81 mg by mouth daily.  . calcium carbonate (CALCIUM 600) 1500 (600 Ca) MG TABS tablet Take 1 capsule by mouth daily.  . Coenzyme Q-10 200 MG CAPS Take 200 mg by mouth daily.  . fluticasone (FLONASE) 50 MCG/ACT nasal spray Place 2 sprays into both nostrils daily.  Marland Kitchen levothyroxine (SYNTHROID, LEVOTHROID) 25 MCG tablet Take 25 mcg by mouth daily.  . metoprolol succinate (TOPROL-XL) 50 MG 24 hr  tablet Take 50 mg by mouth 2 (two) times daily.  . montelukast (SINGULAIR) 10 MG tablet TAKE ONE TABLET BY MOUTH DAILY AT BEDTIME FOR allergies  . simvastatin (ZOCOR) 20 MG tablet Take 20 mg by mouth daily.     Allergies:   Patient has no known allergies.   Social History   Socioeconomic History  . Marital status: Married    Spouse name: Not on file  . Number of children: 3  . Years of education: Not on file  . Highest education level: Not on file  Occupational History  . Occupation: Retired  Scientific laboratory technician  . Financial resource strain: Not on file  . Food insecurity:    Worry: Not on file    Inability: Not on file  .  Transportation needs:    Medical: Not on file    Non-medical: Not on file  Tobacco Use  . Smoking status: Current Every Day Smoker    Types: Cigarettes  . Smokeless tobacco: Never Used  Substance and Sexual Activity  . Alcohol use: Not Currently    Comment: quit over a year ago  . Drug use: No  . Sexual activity: Not on file  Lifestyle  . Physical activity:    Days per week: Not on file    Minutes per session: Not on file  . Stress: Not on file  Relationships  . Social connections:    Talks on phone: Not on file    Gets together: Not on file    Attends religious service: Not on file    Active member of club or organization: Not on file    Attends meetings of clubs or organizations: Not on file    Relationship status: Not on file  Other Topics Concern  . Not on file  Social History Narrative  . Not on file     Family History: The patient's family history includes Diabetes in his mother; Heart attack in his father; Heart disease in his father; Heart failure in his father; Hyperlipidemia in his father; Hypertension in his father; Stroke in his father.  ROS:   Please see the history of present illness.    All other systems reviewed and are negative.  EKGs/Labs/Other Studies Reviewed:    The following studies were reviewed today: I discussed my findings with the patient.   Recent Labs: No results found for requested labs within last 8760 hours.  Recent Lipid Panel No results found for: CHOL, TRIG, HDL, CHOLHDL, VLDL, LDLCALC, LDLDIRECT  Physical Exam:    VS:  BP 118/62 (BP Location: Right Arm, Patient Position: Sitting, Cuff Size: Normal)   Pulse (!) 47   Ht 5\' 6"  (1.676 m)   Wt 141 lb (64 kg)   SpO2 95%   BMI 22.76 kg/m     Wt Readings from Last 3 Encounters:  04/13/18 141 lb (64 kg)  11/03/17 144 lb 2 oz (65.4 kg)  10/22/17 134 lb (60.8 kg)     GEN: Patient is in no acute distress HEENT: Normal NECK: No JVD; No carotid bruits LYMPHATICS: No  lymphadenopathy CARDIAC: Hear sounds regular, 2/6 systolic murmur at the apex. RESPIRATORY:  Clear to auscultation without rales, wheezing or rhonchi  ABDOMEN: Soft, non-tender, non-distended MUSCULOSKELETAL:  No edema; No deformity  SKIN: Warm and dry NEUROLOGIC:  Alert and oriented x 3 PSYCHIATRIC:  Normal affect   Signed, Jenean Lindau, MD  04/13/2018 11:37 AM    [place]

## 2018-04-13 NOTE — Progress Notes (Signed)
bm 

## 2018-04-14 LAB — BASIC METABOLIC PANEL
BUN/Creatinine Ratio: 18 (ref 10–24)
BUN: 40 mg/dL — ABNORMAL HIGH (ref 8–27)
CHLORIDE: 111 mmol/L — AB (ref 96–106)
CO2: 16 mmol/L — AB (ref 20–29)
Calcium: 9.6 mg/dL (ref 8.6–10.2)
Creatinine, Ser: 2.23 mg/dL — ABNORMAL HIGH (ref 0.76–1.27)
GFR calc Af Amer: 30 mL/min/{1.73_m2} — ABNORMAL LOW (ref >59)
GFR calc non Af Amer: 26 mL/min/{1.73_m2} — ABNORMAL LOW (ref >59)
Glucose: 80 mg/dL (ref 65–99)
Potassium: 6.1 mmol/L — ABNORMAL HIGH (ref 3.5–5.2)
Sodium: 140 mmol/L (ref 134–144)

## 2018-04-14 LAB — CBC
HEMATOCRIT: 41.6 % (ref 37.5–51.0)
Hemoglobin: 13.5 g/dL (ref 13.0–17.7)
MCH: 30.8 pg (ref 26.6–33.0)
MCHC: 32.5 g/dL (ref 31.5–35.7)
MCV: 95 fL (ref 79–97)
Platelets: 238 10*3/uL (ref 150–450)
RBC: 4.38 x10E6/uL (ref 4.14–5.80)
RDW: 13.6 % (ref 11.6–15.4)
WBC: 8.1 10*3/uL (ref 3.4–10.8)

## 2018-04-14 LAB — HEPATIC FUNCTION PANEL
ALT: 11 IU/L (ref 0–44)
AST: 14 IU/L (ref 0–40)
Albumin: 4.2 g/dL (ref 3.6–4.6)
Alkaline Phosphatase: 102 IU/L (ref 39–117)
Bilirubin Total: 0.3 mg/dL (ref 0.0–1.2)
Bilirubin, Direct: 0.1 mg/dL (ref 0.00–0.40)
Total Protein: 6.7 g/dL (ref 6.0–8.5)

## 2018-04-14 LAB — LIPID PANEL
Chol/HDL Ratio: 2.5 ratio (ref 0.0–5.0)
Cholesterol, Total: 122 mg/dL (ref 100–199)
HDL: 48 mg/dL (ref >39)
LDL Calculated: 52 mg/dL (ref 0–99)
TRIGLYCERIDES: 111 mg/dL (ref 0–149)
VLDL CHOLESTEROL CAL: 22 mg/dL (ref 5–40)

## 2018-04-14 LAB — TSH: TSH: 2.06 u[IU]/mL (ref 0.450–4.500)

## 2018-04-14 MED ORDER — KAYEXALATE PO POWD: 30.0000 g | Freq: Once | ORAL | Status: AC

## 2018-04-14 MED ORDER — KAYEXALATE PO POWD: 30.0000 g | Freq: Once | ORAL | Status: DC

## 2018-04-14 NOTE — Telephone Encounter (Signed)
-----   Message from Jenean Lindau, MD sent at 04/14/2018  8:26 AM EDT ----- Labs are fine except advanced renal insufficiency and elevated potassium.  He needs to take Kayexalate 30 g and recheck Chem-7 on Monday.  He needs to get established with a nephrologist if he does not have one and follow this lab work.  Please also send it to primary care letting them know of what we have done.   Jenean Lindau, MD 04/14/2018 8:24 AM

## 2018-04-14 NOTE — Telephone Encounter (Signed)
Patient advised that his preferred pharmacy does not have inventory of Kayexalate. Patient states he has lokelma in medicine cabinet and would prefer to use it. No further questions at this time. RN forwarded information to DR. RRR to review.

## 2018-04-14 NOTE — Telephone Encounter (Signed)
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 He is currently scheduled with Dr.Patel on 04/25/18 but is OK with taking kayexalate. He will have follow up labs done there and forward information to Dr. Geraldo Pitter.Copy of results sent to [place] per Dr.RRR request.

## 2018-04-15 NOTE — Telephone Encounter (Signed)
That is fine.  Please advised on correct dose of Lokelma and let him come back for a Chem-7 early next week

## 2018-04-26 DIAGNOSIS — N189 Chronic kidney disease, unspecified: Secondary | ICD-10-CM | POA: Diagnosis not present

## 2018-04-26 DIAGNOSIS — N2581 Secondary hyperparathyroidism of renal origin: Secondary | ICD-10-CM | POA: Diagnosis not present

## 2018-04-26 DIAGNOSIS — N183 Chronic kidney disease, stage 3 (moderate): Secondary | ICD-10-CM | POA: Diagnosis not present

## 2018-04-27 ENCOUNTER — Telehealth: Payer: Self-pay | Admitting: *Deleted

## 2018-04-27 NOTE — Telephone Encounter (Signed)
Cancelled AAA, pt knows

## 2018-05-17 ENCOUNTER — Other Ambulatory Visit: Payer: Medicare Other

## 2018-06-24 DIAGNOSIS — Z0000 Encounter for general adult medical examination without abnormal findings: Secondary | ICD-10-CM | POA: Diagnosis not present

## 2018-06-24 DIAGNOSIS — Z1331 Encounter for screening for depression: Secondary | ICD-10-CM | POA: Diagnosis not present

## 2018-06-24 DIAGNOSIS — E785 Hyperlipidemia, unspecified: Secondary | ICD-10-CM | POA: Diagnosis not present

## 2018-06-24 DIAGNOSIS — Z9181 History of falling: Secondary | ICD-10-CM | POA: Diagnosis not present

## 2018-07-01 DIAGNOSIS — E785 Hyperlipidemia, unspecified: Secondary | ICD-10-CM | POA: Diagnosis not present

## 2018-07-01 DIAGNOSIS — M1990 Unspecified osteoarthritis, unspecified site: Secondary | ICD-10-CM | POA: Diagnosis not present

## 2018-07-01 DIAGNOSIS — E039 Hypothyroidism, unspecified: Secondary | ICD-10-CM | POA: Diagnosis not present

## 2018-07-01 DIAGNOSIS — I10 Essential (primary) hypertension: Secondary | ICD-10-CM | POA: Diagnosis not present

## 2018-07-05 DIAGNOSIS — E785 Hyperlipidemia, unspecified: Secondary | ICD-10-CM | POA: Diagnosis not present

## 2018-07-05 DIAGNOSIS — E559 Vitamin D deficiency, unspecified: Secondary | ICD-10-CM | POA: Diagnosis not present

## 2018-07-05 DIAGNOSIS — E039 Hypothyroidism, unspecified: Secondary | ICD-10-CM | POA: Diagnosis not present

## 2018-07-05 DIAGNOSIS — Z79899 Other long term (current) drug therapy: Secondary | ICD-10-CM | POA: Diagnosis not present

## 2018-07-05 DIAGNOSIS — C61 Malignant neoplasm of prostate: Secondary | ICD-10-CM | POA: Diagnosis not present

## 2018-07-07 ENCOUNTER — Encounter: Payer: Self-pay | Admitting: Gastroenterology

## 2018-07-08 ENCOUNTER — Other Ambulatory Visit: Payer: Self-pay

## 2018-07-08 ENCOUNTER — Encounter: Payer: Self-pay | Admitting: Gastroenterology

## 2018-07-08 ENCOUNTER — Ambulatory Visit (INDEPENDENT_AMBULATORY_CARE_PROVIDER_SITE_OTHER): Payer: Medicare Other | Admitting: Gastroenterology

## 2018-07-08 VITALS — BP 112/53 | HR 45 | Temp 96.5°F | Ht 65.0 in | Wt 135.0 lb

## 2018-07-08 DIAGNOSIS — Z86010 Personal history of colonic polyps: Secondary | ICD-10-CM

## 2018-07-08 DIAGNOSIS — R634 Abnormal weight loss: Secondary | ICD-10-CM | POA: Diagnosis not present

## 2018-07-08 MED ORDER — OMEPRAZOLE 20 MG PO CPDR: 20.0000 mg | DELAYED_RELEASE_CAPSULE | Freq: Every day | ORAL | 3 refills | Status: DC

## 2018-07-08 NOTE — Progress Notes (Signed)
Chief Complaint: History of weight loss  Referring Provider:  Nicholos Johns, MD      ASSESSMENT AND PLAN;   #1.  Melena (resolved) with Hb 12 to 9.0Diarrhea (resolved) #2.  Weight loss   #3.  H/O colonic polyps/FH of colon cancer-daughter at age below 19. #4.  Comorbid conditions include CKD4 with one kidney, metabolic acidosis (Dr Posey Pronto), HTN, HLD, hypothyroidism, AAA, COPD, s/p R nephrectomy due to [place], H/O prostate Ca.  Plan: -EGD ASAP. -Agree to hold ASA -Omeprazole 20mg  po qd #30, 2 refills. -He will also call Dr. Posey Pronto. May need HD in future.    Addendum: CT 09/2016: Colonic diverticulosis, right nephrectomy, AAA 5.4 cm (being followed by Dr Geraldo Pitter), DJD HPI:    Dustin Vaughan is a 85 y.o. male  Seen at request of Dr. Rica Records Seen as emergency workin Had dark stools for 1 week which now has cleared. Seen by Dr. Rica Records Hemoglobin dropped down from baseline of 12 to 9.0.  Aspirin was held.  Currently patient does feel better.  He has been started on sodium bicarbonate for metabolic acidosis and Kayexalate for hyperkalemia. K better at 5.1 (07/07/2018).  His weight has been fluctuating.  Overall he has lost weight from 165lb to 130lb over the last 1 year, recently gained 5 pounds.  Appetite still depressed but improving.   Labs from July 07, 2018-BUN 76/creatinine 2.69 with GFR 21 mL/min, CO2 14, albumin 3.5, normal LFTs, hemoglobin 9.0, MCV 93, platelets 239K  Has been playing golf every day now.-He is a Office manager pro and teaches golf as well.  Has history of colonic polyps on colonoscopy 11/2011-1 cm a sending colon polyp status post polypectomy, moderate predominantly sigmoid diverticulosis, mild radiation-induced chronic changes in the rectum.  He was supposed to have repeat colonoscopy done in 2016-but wanted to hold off due to her daughter's illness.  Still would like to hold off on colonoscopy.   Past Medical History:  Diagnosis Date  .  Aneurysm, aortic ([place])   . Cancer of kidney ([place]) 1997   right removed   . COPD (chronic obstructive pulmonary disease) ([place])   . Coronary artery disease   . Hemorrhoids, external without complications   . High blood pressure   . HTN (hypertension)   . Hypercholesteremia   . Hyperlipidemia   . Osteoarthritis   . Pneumonia   . Prostate cancer ([place])   . Renal insufficiency   . Vitamin D deficiency   . Weight loss     Past Surgical History:  Procedure Laterality Date  . ANGIOPLASTY  02/26/2017  . COLONOSCOPY  12/09/2011   Colonic polyp status post polypectomy. [place] predominantly sigmoid diverticulosis. Mid radiationinduced chronic changes in the rectum. Otherwise normal colonoscopy. Tubular adenoma.   Marland Kitchen KIDNEY SURGERY Right 1997   only have 1 kidney. Left pt has     Family History  Problem Relation Age of Onset  . Diabetes Mother   . Heart disease Father   . Heart failure Father   . Hyperlipidemia Father   . Hypertension Father   . Heart attack Father   . Stroke Father   . Colon cancer Neg Hx     Social History   Tobacco Use  . Smoking status: Current Every Day Smoker    Types: Cigarettes  . Smokeless tobacco: Never Used  Substance Use Topics  . Alcohol use: Not Currently    Comment: quit over a year ago  . Drug use: No  Current Outpatient Medications  Medication Instructions  . amLODipine (NORVASC) 10 mg, Oral, Daily  . aspirin EC 81 mg, Oral, Daily  . calcium carbonate (CALCIUM 600) 1500 (600 Ca) MG TABS tablet 1 capsule, Oral, Daily  . Coenzyme Q-10 200 mg, Oral, Daily  . fluticasone (FLONASE) 50 MCG/ACT nasal spray 2 sprays, Each Nare, Daily  . levothyroxine (SYNTHROID) 25 mcg, Oral, Daily  . metoprolol succinate (TOPROL-XL) 50 mg, Oral, 2 times daily  . montelukast (SINGULAIR) 10 MG tablet TAKE ONE TABLET BY MOUTH DAILY AT BEDTIME FOR allergies  . omeprazole (PRILOSEC) 20 mg, Oral, Daily  . simvastatin (ZOCOR) 20 mg, Oral, Daily  Also taking  Kayexalate and soda bicarb  No Known Allergies  Review of Systems:  neg     Physical Exam:     Filed Weights   07/08/18 1333  Weight: 135 lb (61.2 kg)   televisit   This service was provided via telemedicine.  The patient was located at home.  The provider was located in office.  The patient did consent to this telephone visit and is aware of possible charges through their insurance for this visit.  The patient was referred by Dr. Rica Records.    Time spent on call: 15 min     Carmell Austria, MD 07/08/2018, 3:53 PM  Cc: Nicholos Johns, MD

## 2018-07-08 NOTE — Patient Instructions (Signed)
If you are age 83 or older, your body mass index should be between 23-30. Your Body mass index is 22.47 kg/m. If this is out of the aforementioned range listed, please consider follow up with your Primary Care Provider.  If you are age 36 or younger, your body mass index should be between 19-25. Your Body mass index is 22.47 kg/m. If this is out of the aformentioned range listed, please consider follow up with your Primary Care Provider.   We have sent the following medications to your pharmacy for you to pick up at your convenience: Omeprazole  It has been recommended to you by your physician that you have a(n) EGD completed. Per your request, we did not schedule the procedure(s) today. Please contact our office at [phone] should you decide to have the procedure completed.  Thank you,  Dr. Jackquline Denmark

## 2018-07-09 ENCOUNTER — Encounter: Payer: Self-pay | Admitting: Gastroenterology

## 2018-07-09 DIAGNOSIS — R634 Abnormal weight loss: Secondary | ICD-10-CM

## 2018-07-09 HISTORY — DX: Abnormal weight loss: R63.4

## 2018-07-12 DIAGNOSIS — R197 Diarrhea, unspecified: Secondary | ICD-10-CM

## 2018-07-12 DIAGNOSIS — R634 Abnormal weight loss: Secondary | ICD-10-CM

## 2018-07-14 ENCOUNTER — Telehealth: Payer: Self-pay | Admitting: Gastroenterology

## 2018-07-14 NOTE — Telephone Encounter (Signed)
Attempt to call pt to ask Covid-19 screening questions, no option to lvm  Covid-19 Screening Questions:   Do you now or have you had a fever in the last 14 days?    Do you have any respiratory symptoms of shortness of breath or cough now or in the last 14 days?    Do you have any family members or close contacts with diagnosed or suspected Covid-19 in the past 14 days?    Have you been tested for Covid-19 and found to be positive?   Pls inform pt that care partner may wait in the car or come up to the lobby during the procedure but will need to provide their own mask.

## 2018-07-15 ENCOUNTER — Other Ambulatory Visit: Payer: Self-pay

## 2018-07-15 ENCOUNTER — Ambulatory Visit (AMBULATORY_SURGERY_CENTER): Payer: Medicare Other | Admitting: Gastroenterology

## 2018-07-15 ENCOUNTER — Encounter: Payer: Self-pay | Admitting: Gastroenterology

## 2018-07-15 VITALS — BP 120/49 | HR 52 | Temp 97.4°F | Resp 16 | Ht 65.0 in | Wt 135.0 lb

## 2018-07-15 DIAGNOSIS — J449 Chronic obstructive pulmonary disease, unspecified: Secondary | ICD-10-CM | POA: Diagnosis not present

## 2018-07-15 DIAGNOSIS — R197 Diarrhea, unspecified: Secondary | ICD-10-CM

## 2018-07-15 DIAGNOSIS — R634 Abnormal weight loss: Secondary | ICD-10-CM | POA: Diagnosis not present

## 2018-07-15 DIAGNOSIS — I10 Essential (primary) hypertension: Secondary | ICD-10-CM | POA: Diagnosis not present

## 2018-07-15 DIAGNOSIS — K449 Diaphragmatic hernia without obstruction or gangrene: Secondary | ICD-10-CM

## 2018-07-15 DIAGNOSIS — K3189 Other diseases of stomach and duodenum: Secondary | ICD-10-CM | POA: Diagnosis not present

## 2018-07-15 DIAGNOSIS — K2970 Gastritis, unspecified, without bleeding: Secondary | ICD-10-CM | POA: Diagnosis not present

## 2018-07-15 DIAGNOSIS — K2980 Duodenitis without bleeding: Secondary | ICD-10-CM

## 2018-07-15 DIAGNOSIS — K219 Gastro-esophageal reflux disease without esophagitis: Secondary | ICD-10-CM | POA: Diagnosis not present

## 2018-07-15 DIAGNOSIS — K2950 Unspecified chronic gastritis without bleeding: Secondary | ICD-10-CM | POA: Diagnosis not present

## 2018-07-15 MED ORDER — SODIUM CHLORIDE 0.9 % IV SOLN: 500.0000 mL | Freq: Once | INTRAVENOUS | Status: DC

## 2018-07-15 NOTE — Patient Instructions (Addendum)
INFORMATION ON GASTRITIS AND ON HIATAL HERNIA GIVEN TO YOU TODAY  BIOPSIES TAKEN -AWAIT RESULTS  RETURN TO GI CLINIC DR GUPTA IN 8 WEEKS -MAKE APPOINTMENT  NO ASPIRIN, ASPIRIN CONTAINING PRODUCTS (BC OR GOODY POWDERS) OR NSAIDS (IBUPROFEN, ADVIL, ALEVE, AND MOTRIN) FOR 5 DAYS; TYLENOL IS OK TO TAKE, BABY ASPIRIN IS OK THEREAFTER IF NEEDED  RECOMMEND FOLLOWING UP WITH NEPHROLOGY   CONTINUE OMEPRAZOLE 20 MG ONCE A DAY   YOU HAD AN ENDOSCOPIC PROCEDURE TODAY AT THE [facility]:   Refer to the procedure report that was given to you for any specific questions about what was found during the examination.  If the procedure report does not answer your questions, please call your gastroenterologist to clarify.  If you requested that your care partner not be given the details of your procedure findings, then the procedure report has been included in a sealed envelope for you to review at your convenience later.  YOU SHOULD EXPECT: Some feelings of bloating in the abdomen. Passage of more gas than usual.  Walking can help get rid of the air that was put into your GI tract during the procedure and reduce the bloating. If you had a lower endoscopy (such as a colonoscopy or flexible sigmoidoscopy) you may notice spotting of blood in your stool or on the toilet paper. If you underwent a bowel prep for your procedure, you may not have a normal bowel movement for a few days.  Please Note:  You might notice some irritation and congestion in your nose or some drainage.  This is from the oxygen used during your procedure.  There is no need for concern and it should clear up in a day or so.  SYMPTOMS TO REPORT IMMEDIATELY:     Following upper endoscopy (EGD)  Vomiting of blood or coffee ground material  New chest pain or pain under the shoulder blades  Painful or persistently difficult swallowing  New shortness of breath  Fever of 100F or higher  Black, tarry-looking stools  For urgent or  emergent issues, a gastroenterologist can be reached at any hour by calling [phone].   DIET:  We do recommend a small meal at first, but then you may proceed to your regular diet.  Drink plenty of fluids but you should avoid alcoholic beverages for 24 hours.  ACTIVITY:  You should plan to take it easy for the rest of today and you should NOT DRIVE or use heavy machinery until tomorrow (because of the sedation medicines used during the test).    FOLLOW UP: Our staff will call the number listed on your records 48-72 hours following your procedure to check on you and address any questions or concerns that you may have regarding the information given to you following your procedure. If we do not reach you, we will leave a message.  We will attempt to reach you two times.  During this call, we will ask if you have developed any symptoms of COVID 19. If you develop any symptoms (ie: fever, flu-like symptoms, shortness of breath, cough etc.) before then, please call [phone].  If you test positive for Covid 19 in the 2 weeks post procedure, please call and report this information to [place].    If any biopsies were taken you will be contacted by phone or by letter within the next 1-3 weeks.  Please call us at [phone] if you have not heard about the biopsies in 3 weeks.    SIGNATURES/CONFIDENTIALITY:  You and/or your care partner have signed paperwork which will be entered into your electronic medical record.  These signatures attest to the fact that that the information above on your After Visit Summary has been reviewed and is understood.  Full responsibility of the confidentiality of this discharge information lies with you and/or your care-partner.

## 2018-07-15 NOTE — Progress Notes (Signed)
Called to room to assist during endoscopic procedure.  Patient ID and intended procedure confirmed with present staff. Received instructions for my participation in the procedure from the performing physician.  

## 2018-07-15 NOTE — Progress Notes (Signed)
A and O x3. Report to RN. Tolerated MAC anesthesia well.Teeth unchanged after procedure.

## 2018-07-15 NOTE — Op Note (Signed)
[facility] Patient Name: Dustin Vaughan Procedure Date: 07/15/2018 2:43 PM MRN: [id number] Endoscopist: Jackquline Denmark , MD Age: 85 Referring MD:  Date of Birth: Apr 23, 1932 Gender: Male Account #: [id number] Procedure:                Upper GI endoscopy Indications:              Acute post hemorrhagic anemia, Heartburn Medicines:                Monitored Anesthesia Care Procedure:                Pre-Anesthesia Assessment:                           - Prior to the procedure, a History and Physical                            was performed, and patient medications and                            allergies were reviewed. The patient's tolerance of                            previous anesthesia was also reviewed. The risks                            and benefits of the procedure and the sedation                            options and risks were discussed with the patient.                            All questions were answered, and informed consent                            was obtained. Prior Anticoagulants: The patient has                            taken no previous anticoagulant or antiplatelet                            agents. ASA Grade Assessment: III - A patient with                            severe systemic disease. After reviewing the risks                            and benefits, the patient was deemed in                            satisfactory condition to undergo the procedure.                           After obtaining informed consent, the endoscope was  passed under direct vision. Throughout the                            procedure, the patient's blood pressure, pulse, and                            oxygen saturations were monitored continuously. The                            Endoscope was introduced through the mouth, and                            advanced to the second part of duodenum. The upper                            GI endoscopy  was accomplished without difficulty.                            The patient tolerated the procedure well. Scope In: Scope Out: Findings:                 The esophagus was mildly tortuous especially lower                            one third of the esophagus due to medium-sized                            hiatal hernia extending from 35 up to 40 cm (GE                            junction up to diaphragmatic hiatus). Wide-open                            Schatzki's ring was noted at the GE junction. No                            Cameron erosions. No active bleeding.                           Moderate inflammation characterized by friability                            and granularity was found in the entire examined                            stomach. Biopsies were taken with a cold forceps                            for histology. Estimated blood loss: none. No                            active bleeding.  8-10 inflammatory appearing polyps measuring 6 to 8                            mm were noted in the first portion of the duodenum.                            Multiple biopsies were obtained and sent for                            histology.                           Diffuse mucosal flattening was found in the second                            portion of the duodenum. Biopsies were taken with a                            cold forceps for histology. Estimated blood loss:                            none. Complications:            No immediate complications. Estimated Blood Loss:     Estimated blood loss: none. Impression:               - Medium-sized hiatal hernia.                           - Moderate gastritis                           - Duodenal polyps likely inflammatory (biopsied).                           - Flattened mucosa was found in the duodenum. ?                            Importance. Biopsied.                           - No active  bleeding. Recommendation:           - Patient has a contact number available for                            emergencies. The signs and symptoms of potential                            delayed complications were discussed with the                            patient. Return to normal activities tomorrow.                            Written discharge instructions were provided to the  patient.                           - Resume previous diet.                           - Continue omeprazole 20 mg p.o. once a day.                           - No aspirin, ibuprofen, naproxen, or other                            non-steroidal anti-inflammatory drugs for 5 days                            after biopsy. Can resume baby aspirin thereafter if                            needed.                           - Await pathology results.                           - Return to GI clinic in 8 weeks.                           - Also recommend follow-up with nephrology. ?                            Benefit from [name] for anemia. Jackquline Denmark, MD 07/15/2018 3:05:33 PM This report has been signed electronically.

## 2018-07-19 ENCOUNTER — Telehealth: Payer: Self-pay | Admitting: *Deleted

## 2018-07-19 NOTE — Telephone Encounter (Signed)
1. Have you developed a fever since your procedure? no  2.   Have you had an respiratory symptoms (SOB or cough) since your procedure? no  3.   Have you tested positive for COVID 19 since your procedure? no  4.   Have you had any family members/close contacts diagnosed with the COVID 19 since your procedure?  no   If yes to any of these questions please route to Joylene John, RN and Alphonsa Gin, Therapist, sports.     Follow up Call-  Call back number 07/15/2018  Post procedure Call Back phone  # [phone]  Permission to leave phone message Yes  Some recent data might be hidden     Patient questions:  Do you have a fever, pain , or abdominal swelling? No. Pain Score  0 *  Have you tolerated food without any problems? Yes.    Have you been able to return to your normal activities? Yes.    Do you have any questions about your discharge instructions: Diet   No. Medications  No. Follow up visit  No.  Do you have questions or concerns about your Care? No.  Actions: * If pain score is 4 or above: No action needed, pain <4.

## 2018-07-25 ENCOUNTER — Encounter: Payer: Self-pay | Admitting: Gastroenterology

## 2018-08-12 DIAGNOSIS — Z905 Acquired absence of kidney: Secondary | ICD-10-CM | POA: Diagnosis not present

## 2018-08-12 DIAGNOSIS — N281 Cyst of kidney, acquired: Secondary | ICD-10-CM | POA: Diagnosis not present

## 2018-08-12 DIAGNOSIS — N189 Chronic kidney disease, unspecified: Secondary | ICD-10-CM | POA: Diagnosis not present

## 2018-08-12 DIAGNOSIS — D631 Anemia in chronic kidney disease: Secondary | ICD-10-CM | POA: Diagnosis not present

## 2018-08-23 DIAGNOSIS — N183 Chronic kidney disease, stage 3 (moderate): Secondary | ICD-10-CM | POA: Diagnosis not present

## 2018-08-26 DIAGNOSIS — H25813 Combined forms of age-related cataract, bilateral: Secondary | ICD-10-CM | POA: Diagnosis not present

## 2018-08-26 DIAGNOSIS — E875 Hyperkalemia: Secondary | ICD-10-CM | POA: Diagnosis not present

## 2018-08-26 DIAGNOSIS — D631 Anemia in chronic kidney disease: Secondary | ICD-10-CM | POA: Diagnosis not present

## 2018-08-26 DIAGNOSIS — N189 Chronic kidney disease, unspecified: Secondary | ICD-10-CM | POA: Diagnosis not present

## 2018-08-29 DIAGNOSIS — D631 Anemia in chronic kidney disease: Secondary | ICD-10-CM | POA: Diagnosis not present

## 2018-08-29 DIAGNOSIS — N2581 Secondary hyperparathyroidism of renal origin: Secondary | ICD-10-CM | POA: Diagnosis not present

## 2018-08-29 DIAGNOSIS — I129 Hypertensive chronic kidney disease with stage 1 through stage 4 chronic kidney disease, or unspecified chronic kidney disease: Secondary | ICD-10-CM | POA: Diagnosis not present

## 2018-08-29 DIAGNOSIS — N183 Chronic kidney disease, stage 3 (moderate): Secondary | ICD-10-CM | POA: Diagnosis not present

## 2018-10-04 DIAGNOSIS — I10 Essential (primary) hypertension: Secondary | ICD-10-CM | POA: Diagnosis not present

## 2018-10-04 DIAGNOSIS — N189 Chronic kidney disease, unspecified: Secondary | ICD-10-CM | POA: Diagnosis not present

## 2018-10-04 DIAGNOSIS — E039 Hypothyroidism, unspecified: Secondary | ICD-10-CM | POA: Diagnosis not present

## 2018-10-04 DIAGNOSIS — D631 Anemia in chronic kidney disease: Secondary | ICD-10-CM | POA: Diagnosis not present

## 2018-10-14 ENCOUNTER — Ambulatory Visit (INDEPENDENT_AMBULATORY_CARE_PROVIDER_SITE_OTHER): Payer: Medicare Other | Admitting: Cardiology

## 2018-10-14 ENCOUNTER — Encounter: Payer: Self-pay | Admitting: Cardiology

## 2018-10-14 ENCOUNTER — Other Ambulatory Visit: Payer: Self-pay

## 2018-10-14 VITALS — BP 120/60 | HR 53 | Ht 65.0 in | Wt 143.0 lb

## 2018-10-14 DIAGNOSIS — F17200 Nicotine dependence, unspecified, uncomplicated: Secondary | ICD-10-CM

## 2018-10-14 DIAGNOSIS — E782 Mixed hyperlipidemia: Secondary | ICD-10-CM | POA: Diagnosis not present

## 2018-10-14 DIAGNOSIS — I714 Abdominal aortic aneurysm, without rupture, unspecified: Secondary | ICD-10-CM

## 2018-10-14 DIAGNOSIS — I2510 Atherosclerotic heart disease of native coronary artery without angina pectoris: Secondary | ICD-10-CM

## 2018-10-14 DIAGNOSIS — I723 Aneurysm of iliac artery: Secondary | ICD-10-CM

## 2018-10-14 NOTE — Patient Instructions (Addendum)
Medication Instructions:  Your physician recommends that you continue on your current medications as directed. Please refer to the Current Medication list given to you today.  If you need a refill on your cardiac medications before your next appointment, please call your pharmacy.   Lab work: NONE If you have labs (blood work) drawn today and your tests are completely normal, you will receive your results only by: Marland Kitchen MyChart Message (if you have MyChart) OR . A paper copy in the mail If you have any lab test that is abnormal or we need to change your treatment, we will call you to review the results.  Testing/Procedures: You had an EKG performed today  Follow-Up: At [facility], you and your health needs are our priority.  As part of our continuing mission to provide you with exceptional heart care, we have created designated Provider Care Teams.  These Care Teams include your primary Cardiologist (physician) and Advanced Practice Providers (APPs -  Physician Assistants and Nurse Practitioners) who all work together to provide you with the care you need, when you need it. You will need a follow up appointment in 6 months.

## 2018-10-14 NOTE — Progress Notes (Signed)
Cardiology Office Note:    Date:  10/14/2018   ID:  Dustin Vaughan, DOB Jan 18, 1933, MRN [id number]  PCP:  Nicholos Johns, MD  Cardiologist:  Jenean Lindau, MD   Referring MD: Nicholos Johns, MD    ASSESSMENT:    1. Coronary artery disease involving native coronary artery of native heart without angina pectoris   2. Aneurysm of iliac artery (HCC)   3. Abdominal aortic aneurysm (AAA) without rupture ([place])   4. Mixed hyperlipidemia   5. Smoker    PLAN:    In order of problems listed above:  1. Coronary artery disease: Secondary prevention stressed with the patient.  Importance of compliance with diet and medication stressed and he vocalized understanding. 2. Essential hypertension: Blood pressure stable 3. Mixed dyslipidemia: His blood work was done with primary care physician and we will obtain a copy of those reports 4. Abdominal aortic aneurysm: He is scheduled for ultrasound to evaluate the progress of this.  We will keep you updated about it. 5. Patient will be seen in follow-up appointment in 6 months or earlier if the patient has any concerns    Medication Adjustments/Labs and Tests Ordered: Current medicines are reviewed at length with the patient today.  Concerns regarding medicines are outlined above.  No orders of the defined types were placed in this encounter.  No orders of the defined types were placed in this encounter.    Chief Complaint  Patient presents with  . Follow-up     History of Present Illness:    Dustin Vaughan is a 85 y.o. male.  Patient has past medical history of coronary artery disease, essential hypertension and dyslipidemia.  He also has abdominal aortic aneurysm.  He denies any problems at this time and takes care of activities of daily living.  He is a very active gentleman.  At the time of my evaluation, the patient is alert awake oriented and in no distress.  Past Medical History:  Diagnosis Date  . Aneurysm, aortic ([place])   . Cancer  of kidney ([place]) 1997   right removed   . Cataract   . COPD (chronic obstructive pulmonary disease) ([place])   . Coronary artery disease   . Hemorrhoids, external without complications   . High blood pressure   . HTN (hypertension)   . Hypercholesteremia   . Hyperlipidemia   . Osteoarthritis   . Pneumonia   . Prostate cancer ([place])   . Renal insufficiency   . Vitamin D deficiency   . Weight loss     Past Surgical History:  Procedure Laterality Date  . ANGIOPLASTY  02/26/2017  . COLONOSCOPY  12/09/2011   Colonic polyp status post polypectomy. [place] predominantly sigmoid diverticulosis. Mid radiationinduced chronic changes in the rectum. Otherwise normal colonoscopy. Tubular adenoma.   Marland Kitchen KIDNEY SURGERY Right 1997   only have 1 kidney. Left pt has     Current Medications: Current Meds  Medication Sig  . amLODipine (NORVASC) 10 MG tablet Take 10 mg by mouth daily.  Marland Kitchen aspirin EC 81 MG tablet Take 81 mg by mouth daily.  . calcium carbonate (CALCIUM 600) 1500 (600 Ca) MG TABS tablet Take 1 capsule by mouth daily.  . Coenzyme Q-10 200 MG CAPS Take 200 mg by mouth daily.  . fluticasone (FLONASE) 50 MCG/ACT nasal spray Place 2 sprays into both nostrils daily.  . hydrALAZINE (APRESOLINE) 50 MG tablet Take 50 mg by mouth every 8 (eight) hours.  Marland Kitchen levothyroxine (SYNTHROID, LEVOTHROID) 25 MCG tablet  Take 25 mcg by mouth daily.  . metoprolol succinate (TOPROL-XL) 50 MG 24 hr tablet Take 50 mg by mouth 2 (two) times daily.  . montelukast (SINGULAIR) 10 MG tablet TAKE ONE TABLET BY MOUTH DAILY AT BEDTIME FOR allergies  . omeprazole (PRILOSEC) 20 MG capsule Take 1 capsule (20 mg total) by mouth daily.  . simvastatin (ZOCOR) 20 MG tablet Take 20 mg by mouth 2 (two) times daily.      Allergies:   Patient has no known allergies.   Social History   Socioeconomic History  . Marital status: Married    Spouse name: Not on file  . Number of children: 3  . Years of education: Not on file  .  Highest education level: Not on file  Occupational History  . Occupation: Retired  Scientific laboratory technician  . Financial resource strain: Not on file  . Food insecurity    Worry: Not on file    Inability: Not on file  . Transportation needs    Medical: Not on file    Non-medical: Not on file  Tobacco Use  . Smoking status: Current Every Day Smoker    Types: Cigarettes  . Smokeless tobacco: Never Used  Substance and Sexual Activity  . Alcohol use: Not Currently    Comment: quit over a year ago  . Drug use: No  . Sexual activity: Not on file  Lifestyle  . Physical activity    Days per week: Not on file    Minutes per session: Not on file  . Stress: Not on file  Relationships  . Social Herbalist on phone: Not on file    Gets together: Not on file    Attends religious service: Not on file    Active member of club or organization: Not on file    Attends meetings of clubs or organizations: Not on file    Relationship status: Not on file  Other Topics Concern  . Not on file  Social History Narrative  . Not on file     Family History: The patient's family history includes Diabetes in his mother; Heart attack in his father; Heart disease in his father; Heart failure in his father; Hyperlipidemia in his father; Hypertension in his father; Stroke in his father. There is no history of Colon cancer.  ROS:   Please see the history of present illness.    All other systems reviewed and are negative.  EKGs/Labs/Other Studies Reviewed:    The following studies were reviewed today: EKG reveals sinus rhythm and right bundle branch block and nonspecific ST-T changes, left anterior hemiblock.   Recent Labs: 04/13/2018: ALT 11; BUN 40; Creatinine, Ser 2.23; Hemoglobin 13.5; Platelets 238; Potassium 6.1; Sodium 140; TSH 2.060  Recent Lipid Panel    Component Value Date/Time   CHOL 122 04/13/2018 1140   TRIG 111 04/13/2018 1140   HDL 48 04/13/2018 1140   CHOLHDL 2.5 04/13/2018 1140    LDLCALC 52 04/13/2018 1140    Physical Exam:    VS:  BP 120/60 (BP Location: Left Arm, Patient Position: Sitting, Cuff Size: Normal)   Pulse (!) 53   Ht 5\' 5"  (1.651 m)   Wt 143 lb (64.9 kg)   SpO2 95%   BMI 23.80 kg/m     Wt Readings from Last 3 Encounters:  10/14/18 143 lb (64.9 kg)  07/15/18 135 lb (61.2 kg)  07/08/18 135 lb (61.2 kg)     GEN: Patient is in no  acute distress HEENT: Normal NECK: No JVD; No carotid bruits LYMPHATICS: No lymphadenopathy CARDIAC: Hear sounds regular, 2/6 systolic murmur at the apex. RESPIRATORY:  Clear to auscultation without rales, wheezing or rhonchi  ABDOMEN: Soft, non-tender, non-distended MUSCULOSKELETAL:  No edema; No deformity  SKIN: Warm and dry NEUROLOGIC:  Alert and oriented x 3 PSYCHIATRIC:  Normal affect   Signed, Jenean Lindau, MD  10/14/2018 1:28 PM    [facility]

## 2018-10-24 DIAGNOSIS — I10 Essential (primary) hypertension: Secondary | ICD-10-CM | POA: Diagnosis not present

## 2018-10-24 DIAGNOSIS — N289 Disorder of kidney and ureter, unspecified: Secondary | ICD-10-CM | POA: Diagnosis not present

## 2018-10-24 DIAGNOSIS — R634 Abnormal weight loss: Secondary | ICD-10-CM | POA: Diagnosis not present

## 2018-10-24 DIAGNOSIS — E785 Hyperlipidemia, unspecified: Secondary | ICD-10-CM | POA: Diagnosis not present

## 2018-11-04 ENCOUNTER — Ambulatory Visit (INDEPENDENT_AMBULATORY_CARE_PROVIDER_SITE_OTHER): Payer: Medicare Other

## 2018-11-04 ENCOUNTER — Other Ambulatory Visit: Payer: Self-pay

## 2018-11-04 ENCOUNTER — Telehealth: Payer: Self-pay | Admitting: *Deleted

## 2018-11-04 DIAGNOSIS — I714 Abdominal aortic aneurysm, without rupture, unspecified: Secondary | ICD-10-CM

## 2018-11-04 MED ORDER — APIXABAN 2.5 MG PO TABS: 2.5000 mg | ORAL_TABLET | Freq: Two times a day (BID) | ORAL | 0 refills | Status: DC

## 2018-11-04 NOTE — Telephone Encounter (Signed)
Patient informed of AAA duplex results and reports black stools for the past 6 months. Reviewed with Dr. Geraldo Pitter who advised to send in the prescription for eliquis 2.5 mg twice daily but patient should not start taking this medication unless advised by Dr. Geraldo Pitter during a call over the weekend. Urgent vascular surgery referral placed per Dr. Geraldo Pitter for further evaluation. Advised patient he will be contacted to schedule an appointment. Patient verbalized understanding. No further questions.

## 2018-11-04 NOTE — Progress Notes (Signed)
Abdominal aortic duplex exam has been performed. Saccular aneurysms seen in the mid / distal abdominal aortic segments, extending into the common iliac bifurcation. The largest diameter is 5.76 cm.   Dustin Vaughan RDCS, RVT

## 2018-11-04 NOTE — Telephone Encounter (Signed)
-----   Message from Jenean Lindau, MD sent at 11/04/2018  5:01 PM EDT ----- As I discussed with you please call the patient and see if he has any issues with bleeding like black stools or any such problems.  If not start Eliquis 2.5 mg twice daily and call us on Monday morning so we can make further plans.  He will need intervention for his aneurysm.  It is dilated and there is thrombus in it.  Copy to primary care Jenean Lindau, MD 11/04/2018 5:01 PM

## 2018-11-09 NOTE — Telephone Encounter (Signed)
Urgent vascular surgery referral was placed on Friday, 11/04/2018. Called patient to see if he has been contacted to schedule an appointment and patient states he has not heard anything yet. Advised patient to contact [facility] in [place] at [phone] to schedule an appointment. Patient is agreeable and verbalized understanding. He will contact our office with any further questions or concerns.

## 2018-12-05 ENCOUNTER — Ambulatory Visit (INDEPENDENT_AMBULATORY_CARE_PROVIDER_SITE_OTHER): Payer: Medicare Other | Admitting: Surgery

## 2018-12-05 ENCOUNTER — Other Ambulatory Visit: Payer: Self-pay

## 2018-12-05 ENCOUNTER — Encounter: Payer: Self-pay | Admitting: Surgery

## 2018-12-05 ENCOUNTER — Other Ambulatory Visit: Payer: Self-pay | Admitting: Surgery

## 2018-12-05 VITALS — BP 137/73 | HR 56 | Temp 97.7°F | Resp 18 | Ht 65.0 in | Wt 140.6 lb

## 2018-12-05 DIAGNOSIS — I2510 Atherosclerotic heart disease of native coronary artery without angina pectoris: Secondary | ICD-10-CM | POA: Diagnosis not present

## 2018-12-05 DIAGNOSIS — I714 Abdominal aortic aneurysm, without rupture, unspecified: Secondary | ICD-10-CM

## 2018-12-05 NOTE — Progress Notes (Signed)
Vascular and Vein Specialist of [facility]  Patient name: Dustin Vaughan MRN: [id number] DOB: Jun 17, 1932 Sex: male   REQUESTING PROVIDER:    Dr. Geraldo Pitter    REASON FOR CONSULT:    AAA  HISTORY OF PRESENT ILLNESS:   Dustin Vaughan is a 85 y.o. male, who is referred for evaluation of a abdominal aortic aneurysm.  The patient has seen Dr. Maryjean Morn in [facility] for a while but did not have vascular follow-up on the left.  He recently had an abdominal ultrasound which showed a 5.8 cm aneurysm.  He denies any abdominal pain.  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  He is medically managed for hypertension.  He is a current smoker.  He is on a statin for hypercholesterolemia.  He is on chronic anticoagulation.  He has chronic renal insufficiency.  His most recent creatinine was in the 2.3 range.  He recently describes 50 pound weight loss.  He is undergone upper endoscopy, but not lower endoscopy.  He also reports blood in his stool.  PAST MEDICAL HISTORY    Past Medical History:  Diagnosis Date  . Aneurysm, aortic ([place])   . Cancer of kidney ([place]) 1997   right removed   . Cataract   . COPD (chronic obstructive pulmonary disease) ([place])   . Coronary artery disease   . Hemorrhoids, external without complications   . High blood pressure   . HTN (hypertension)   . Hypercholesteremia   . Hyperlipidemia   . Osteoarthritis   . Pneumonia   . Prostate cancer ([place])   . Renal insufficiency   . Vitamin D deficiency   . Weight loss      FAMILY HISTORY   Family History  Problem Relation Age of Onset  . Diabetes Mother   . Heart disease Father   . Heart failure Father   . Hyperlipidemia Father   . Hypertension Father   . Heart attack Father   . Stroke Father   . Colon cancer Neg Hx     SOCIAL HISTORY:   Social History   Socioeconomic History  . Marital status: Married    Spouse name: Not on file   . Number of children: 3  . Years of education: Not on file  . Highest education level: Not on file  Occupational History  . Occupation: Retired  Scientific laboratory technician  . Financial resource strain: Not on file  . Food insecurity    Worry: Not on file    Inability: Not on file  . Transportation needs    Medical: Not on file    Non-medical: Not on file  Tobacco Use  . Smoking status: Current Every Day Smoker    Packs/day: 0.50    Types: Cigarettes  . Smokeless tobacco: Never Used  Substance and Sexual Activity  . Alcohol use: Not Currently    Comment: quit over a year ago  . Drug use: No  . Sexual activity: Not on file  Lifestyle  . Physical activity    Days per week: Not on file    Minutes per session: Not on file  . Stress: Not on file  Relationships  . Social Herbalist on phone: Not on file    Gets together: Not on file    Attends religious service: Not on file    Active member of club or organization: Not on file    Attends meetings of clubs or organizations: Not on  file    Relationship status: Not on file  . Intimate partner violence    Fear of current or ex partner: Not on file    Emotionally abused: Not on file    Physically abused: Not on file    Forced sexual activity: Not on file  Other Topics Concern  . Not on file  Social History Narrative  . Not on file    ALLERGIES:    No Known Allergies  CURRENT MEDICATIONS:    Current Outpatient Medications  Medication Sig Dispense Refill  . amLODipine (NORVASC) 10 MG tablet Take 10 mg by mouth daily.    Marland Kitchen aspirin EC 81 MG tablet Take 81 mg by mouth daily.    . calcium carbonate (CALCIUM 600) 1500 (600 Ca) MG TABS tablet Take 1 capsule by mouth daily.    . Coenzyme Q-10 200 MG CAPS Take 200 mg by mouth daily.    Marland Kitchen levothyroxine (SYNTHROID, LEVOTHROID) 25 MCG tablet Take 25 mcg by mouth daily.    . metoprolol succinate (TOPROL-XL) 50 MG 24 hr tablet Take 50 mg by mouth 2 (two) times daily.    Marland Kitchen omeprazole  (PRILOSEC) 20 MG capsule Take 1 capsule (20 mg total) by mouth daily. 30 capsule 3  . patiromer (VELTASSA) 8.4 g packet Take 8.4 g by mouth daily.    . simvastatin (ZOCOR) 20 MG tablet Take 20 mg by mouth 2 (two) times daily.     . sodium bicarbonate 650 MG tablet Take 650 mg by mouth 4 (four) times daily.    Marland Kitchen apixaban (ELIQUIS) 2.5 MG TABS tablet Take 1 tablet (2.5 mg total) by mouth 2 (two) times daily. (Patient not taking: Reported on 12/05/2018) 60 tablet 0   No current facility-administered medications for this visit.     REVIEW OF SYSTEMS:   [X]  denotes positive finding, [ ]  denotes negative finding Cardiac  Comments:  Chest pain or chest pressure: x   Shortness of breath upon exertion:    Short of breath when lying flat:    Irregular heart rhythm:        Vascular    Pain in calf, thigh, or hip brought on by ambulation:    Pain in feet at night that wakes you up from your sleep:     Blood clot in your veins:    Leg swelling:         Pulmonary    Oxygen at home:    Productive cough:     Wheezing:         Neurologic    Sudden weakness in arms or legs:     Sudden numbness in arms or legs:     Sudden onset of difficulty speaking or slurred speech:    Temporary loss of vision in one eye:     Problems with dizziness:         Gastrointestinal    Blood in stool:  x    Vomited blood:         Genitourinary    Burning when urinating:     Blood in urine:        Psychiatric    Major depression:         Hematologic    Bleeding problems:    Problems with blood clotting too easily:        Skin    Rashes or ulcers:        Constitutional    Fever or chills:     PHYSICAL  EXAM:   Vitals:   12/05/18 1408  BP: 137/73  Pulse: (!) 56  Resp: 18  Temp: 97.7 F (36.5 C)  SpO2: 97%  Weight: 140 lb 9.6 oz (63.8 kg)  Height: 5\' 5"  (1.651 m)    GENERAL: The patient is a well-nourished male, in no acute distress. The vital signs are documented above. CARDIAC: There is a  regular rate and rhythm.  VASCULAR: Palpable pedal pulses bilaterally.  Palpable femoral pulses. PULMONARY: Nonlabored respirations ABDOMEN: Soft and non-tender.  Aorta is nontender.  He does have a left flank mass MUSCULOSKELETAL: There are no major deformities or cyanosis. NEUROLOGIC: No focal weakness or paresthesias are detected. SKIN: There are no ulcers or rashes noted. PSYCHIATRIC: The patient has a normal affect.  STUDIES:   Reviewed his ultrasound with 5.8 cm infrarenal abdominal aneurysm  ASSESSMENT and PLAN   AAA: By ultrasound, maximum diameter is 5.8 cm.  This needs to be better evaluated.  I am ordering a fine cut CT scan.  Unfortunately this will need to be done without contrast given the patient's history of nephrectomy and current renal insufficiency with creatinine in the low to mid 2 range.  Is been to get the CT scan this week and follow-up in 1 week.  Prior to his next visit I will also get preoperative ultrasound studies including ABIs, carotid duplex and lower extremity duplex  Left-sided abdominal mass: This is somewhat worrisome as it is very prominent on exam.  He reports blood in his stool as well as significant weight loss over the past several months.  Hopefully the CT scan will shed some light on what this is.   Leia Alf, MD, FACS Vascular and Vein Specialists of [facility] [phone] Pager [phone]

## 2018-12-07 ENCOUNTER — Other Ambulatory Visit: Payer: Self-pay

## 2018-12-07 DIAGNOSIS — I714 Abdominal aortic aneurysm, without rupture, unspecified: Secondary | ICD-10-CM

## 2018-12-09 ENCOUNTER — Telehealth (HOSPITAL_COMMUNITY): Payer: Self-pay

## 2018-12-09 NOTE — Telephone Encounter (Signed)

## 2018-12-12 ENCOUNTER — Other Ambulatory Visit: Payer: Self-pay

## 2018-12-12 ENCOUNTER — Ambulatory Visit (HOSPITAL_COMMUNITY)
Admission: RE | Admit: 2018-12-12 | Discharge: 2018-12-12 | Disposition: A | Discharge status: Home / Self Care | Payer: Medicare Other | Source: Ambulatory Visit | Attending: Surgery | Admitting: Surgery

## 2018-12-12 ENCOUNTER — Ambulatory Visit (INDEPENDENT_AMBULATORY_CARE_PROVIDER_SITE_OTHER)
Admission: RE | Admit: 2018-12-12 | Discharge: 2018-12-12 | Disposition: A | Discharge status: Home / Self Care | Payer: Medicare Other | Source: Ambulatory Visit | Attending: Family | Admitting: Family

## 2018-12-12 ENCOUNTER — Other Ambulatory Visit: Payer: Self-pay | Admitting: Surgery

## 2018-12-12 ENCOUNTER — Ambulatory Visit (INDEPENDENT_AMBULATORY_CARE_PROVIDER_SITE_OTHER): Payer: Medicare Other | Admitting: Surgery

## 2018-12-12 ENCOUNTER — Ambulatory Visit (HOSPITAL_COMMUNITY)
Admission: RE | Admit: 2018-12-12 | Discharge: 2018-12-12 | Disposition: A | Discharge status: Home / Self Care | Payer: Medicare Other | Source: Ambulatory Visit | Attending: Family | Admitting: Family

## 2018-12-12 ENCOUNTER — Encounter: Payer: Self-pay | Admitting: Surgery

## 2018-12-12 VITALS — BP 152/68 | HR 44 | Temp 98.2°F | Resp 20 | Ht 65.0 in | Wt 142.7 lb

## 2018-12-12 DIAGNOSIS — I714 Abdominal aortic aneurysm, without rupture, unspecified: Secondary | ICD-10-CM

## 2018-12-12 DIAGNOSIS — I724 Aneurysm of artery of lower extremity: Secondary | ICD-10-CM | POA: Diagnosis not present

## 2018-12-12 DIAGNOSIS — I6529 Occlusion and stenosis of unspecified carotid artery: Secondary | ICD-10-CM

## 2018-12-12 DIAGNOSIS — I739 Peripheral vascular disease, unspecified: Secondary | ICD-10-CM

## 2018-12-12 DIAGNOSIS — J90 Pleural effusion, not elsewhere classified: Secondary | ICD-10-CM | POA: Diagnosis not present

## 2018-12-12 LAB — POCT I-STAT CREATININE: Creatinine, Ser: 3 mg/dL — ABNORMAL HIGH (ref 0.61–1.24)

## 2018-12-12 NOTE — Addendum Note (Signed)
Addended by: York Cerise C on: 12/12/2018 10:14 AM   Modules accepted: Orders

## 2018-12-12 NOTE — Progress Notes (Signed)
Vascular and Vein Specialist of [facility]  Patient name: Dustin Vaughan MRN: [id number] DOB: 1932-04-10 Sex: male   REASON FOR VISIT:    Follow up  HISOTRY OF PRESENT ILLNESS:   Dustin Vaughan is a 85 y.o. male, who is back to discuss his testing results.   The patient has seen Dr. Maryjean Morn in [facility] in the past.  He recently had an abdominal ultrasound which showed a 5.8 cm aneurysm.  He denies any abdominal pain.  He is back today to discuss the results of all of his studies.  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  He is medically managed for hypertension.  He is a current smoker.  He is on a statin for hypercholesterolemia.  He is on chronic anticoagulation.  He has chronic renal insufficiency.  His most recent creatinine was in the 2.3 range.  He recently describes 50 pound weight loss.  He is undergone upper endoscopy, but not lower endoscopy.  He also reports blood in his stool.  PAST MEDICAL HISTORY:   Past Medical History:  Diagnosis Date  . Aneurysm, aortic ([place])   . Cancer of kidney ([place]) 1997   right removed   . Cataract   . COPD (chronic obstructive pulmonary disease) ([place])   . Coronary artery disease   . Hemorrhoids, external without complications   . High blood pressure   . HTN (hypertension)   . Hypercholesteremia   . Hyperlipidemia   . Osteoarthritis   . Pneumonia   . Prostate cancer ([place])   . Renal insufficiency   . Vitamin D deficiency   . Weight loss      FAMILY HISTORY:   Family History  Problem Relation Age of Onset  . Diabetes Mother   . Heart disease Father   . Heart failure Father   . Hyperlipidemia Father   . Hypertension Father   . Heart attack Father   . Stroke Father   . Colon cancer Neg Hx     SOCIAL HISTORY:   Social History   Tobacco Use  . Smoking status: Current Every Day Smoker    Packs/day: 0.50    Types: Cigarettes  . Smokeless  tobacco: Never Used  Substance Use Topics  . Alcohol use: Not Currently    Comment: quit over a year ago     ALLERGIES:   No Known Allergies   CURRENT MEDICATIONS:   Current Outpatient Medications  Medication Sig Dispense Refill  . amLODipine (NORVASC) 10 MG tablet Take 10 mg by mouth daily.    Marland Kitchen apixaban (ELIQUIS) 2.5 MG TABS tablet Take 1 tablet (2.5 mg total) by mouth 2 (two) times daily. 60 tablet 0  . aspirin EC 81 MG tablet Take 81 mg by mouth daily.    . calcium carbonate (CALCIUM 600) 1500 (600 Ca) MG TABS tablet Take 1 capsule by mouth daily.    . Coenzyme Q-10 200 MG CAPS Take 200 mg by mouth daily.    Marland Kitchen levothyroxine (SYNTHROID, LEVOTHROID) 25 MCG tablet Take 25 mcg by mouth daily.    . metoprolol succinate (TOPROL-XL) 50 MG 24 hr tablet Take 50 mg by mouth 2 (two) times daily.    Marland Kitchen omeprazole (PRILOSEC) 20 MG capsule Take 1 capsule (20 mg total) by mouth daily. 30 capsule 3  . patiromer (VELTASSA) 8.4 g packet Take 8.4 g by mouth daily.    . simvastatin (ZOCOR) 20 MG tablet Take 20 mg by mouth 2 (  two) times daily.     . sodium bicarbonate 650 MG tablet Take 650 mg by mouth 4 (four) times daily.     No current facility-administered medications for this visit.     REVIEW OF SYSTEMS:   [X]  denotes positive finding, [ ]  denotes negative finding Cardiac  Comments:  Chest pain or chest pressure:    Shortness of breath upon exertion:    Short of breath when lying flat:    Irregular heart rhythm:        Vascular    Pain in calf, thigh, or hip brought on by ambulation:    Pain in feet at night that wakes you up from your sleep:     Blood clot in your veins:    Leg swelling:         Pulmonary    Oxygen at home:    Productive cough:     Wheezing:         Neurologic    Sudden weakness in arms or legs:     Sudden numbness in arms or legs:     Sudden onset of difficulty speaking or slurred speech:    Temporary loss of vision in one eye:     Problems with  dizziness:         Gastrointestinal    Blood in stool:     Vomited blood:         Genitourinary    Burning when urinating:     Blood in urine:        Psychiatric    Major depression:         Hematologic    Bleeding problems:    Problems with blood clotting too easily:        Skin    Rashes or ulcers:        Constitutional    Fever or chills:      PHYSICAL EXAM:   Vitals:   12/12/18 1420 12/12/18 1423  BP: (!) 164/70 (!) 152/68  Pulse: (!) 44   Resp: 20   Temp: 98.2 F (36.8 C)   SpO2: 96%   Weight: 142 lb 11.2 oz (64.7 kg)   Height: 5\' 5"  (1.651 m)     GENERAL: The patient is a well-nourished male, in no acute distress. The vital signs are documented above. CARDIAC: There is a regular rate and rhythm.  PULMONARY: Non-labored respirations ABDOMEN: Soft and non-tender  MUSCULOSKELETAL: There are no major deformities or cyanosis. NEUROLOGIC: No focal weakness or paresthesias are detected. SKIN: There are no ulcers or rashes noted. PSYCHIATRIC: The patient has a normal affect.  STUDIES:   I have ordered and reviewed the following: CT scan: Chest.  1. No evidence of thoracic metastasis. 2. Extensive coronary artery calcification. 3. Remote displaced posterior LEFT rib fractures.  Abdomen / Pelvis.  1. Fusiform dilatation of the infrarenal abdominal aorta with a saccular aneurysm extending posteriorly just above the aortic bifurcation. Maximum sagittal diameter 5.3 cm. 2. Aneurysmal dilatation of the LEFT common iliac artery. 3. Post RIGHT nephrectomy without evidence local recurrence. 4. Extensive cystic change of the LEFT kidney with compensatory Hypertrophy.   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  Ectatic right mid popliteal artery measuring 1.26 cm  Carotid duplex: 1-39% bilateral stenosis MEDICAL ISSUES:   AAA: I reviewed the patient's CT scan with him.  I believe the maximum diameter is in the 5.5-5.6 range.  The patient does only have 1 kidney which is not functioning very well.  I discussed with him that if we proceed with repair, I believe that even with minimal use of contrast that there is a 20-30% chance that his renal function will worsen which could require him to need dialysis.  At this time, he is not sure if he wants to go on dialysis.  I am sending him back to Dr. Posey Pronto to make sure that everything is optimized.  The patient also has questions about transplant.  We have elected to monitor the aneurysm for another 6 months.  He understands that he has a 10-20% risk of rupture over the next year.  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  At his request, I will make an appointment to see a urologist.    Leia Alf, MD, FACS Vascular and Vein Specialists of [facility] [phone] Pager [phone]

## 2018-12-27 DIAGNOSIS — N189 Chronic kidney disease, unspecified: Secondary | ICD-10-CM | POA: Diagnosis not present

## 2018-12-27 DIAGNOSIS — D631 Anemia in chronic kidney disease: Secondary | ICD-10-CM | POA: Diagnosis not present

## 2019-01-05 DIAGNOSIS — Z85528 Personal history of other malignant neoplasm of kidney: Secondary | ICD-10-CM | POA: Diagnosis not present

## 2019-01-05 DIAGNOSIS — C61 Malignant neoplasm of prostate: Secondary | ICD-10-CM | POA: Diagnosis not present

## 2019-01-05 DIAGNOSIS — N281 Cyst of kidney, acquired: Secondary | ICD-10-CM | POA: Diagnosis not present

## 2019-02-08 DIAGNOSIS — E559 Vitamin D deficiency, unspecified: Secondary | ICD-10-CM | POA: Diagnosis not present

## 2019-02-08 DIAGNOSIS — Z79899 Other long term (current) drug therapy: Secondary | ICD-10-CM | POA: Diagnosis not present

## 2019-02-08 DIAGNOSIS — E785 Hyperlipidemia, unspecified: Secondary | ICD-10-CM | POA: Diagnosis not present

## 2019-02-08 DIAGNOSIS — E039 Hypothyroidism, unspecified: Secondary | ICD-10-CM | POA: Diagnosis not present

## 2019-02-17 ENCOUNTER — Other Ambulatory Visit: Payer: Self-pay

## 2019-02-17 DIAGNOSIS — I714 Abdominal aortic aneurysm, without rupture, unspecified: Secondary | ICD-10-CM

## 2019-02-17 DIAGNOSIS — I724 Aneurysm of artery of lower extremity: Secondary | ICD-10-CM

## 2019-02-21 DIAGNOSIS — E875 Hyperkalemia: Secondary | ICD-10-CM | POA: Diagnosis not present

## 2019-03-09 ENCOUNTER — Ambulatory Visit
Admission: RE | Admit: 2019-03-09 | Discharge: 2019-03-09 | Disposition: A | Discharge status: Home / Self Care | Payer: Medicare Other | Source: Ambulatory Visit | Attending: Surgery | Admitting: Surgery

## 2019-03-09 DIAGNOSIS — I724 Aneurysm of artery of lower extremity: Secondary | ICD-10-CM

## 2019-03-09 DIAGNOSIS — I714 Abdominal aortic aneurysm, without rupture, unspecified: Secondary | ICD-10-CM

## 2019-03-13 ENCOUNTER — Ambulatory Visit: Payer: Medicare Other | Admitting: Surgery

## 2019-04-07 ENCOUNTER — Telehealth (HOSPITAL_COMMUNITY): Payer: Self-pay

## 2019-04-07 NOTE — Telephone Encounter (Signed)

## 2019-04-10 ENCOUNTER — Ambulatory Visit (INDEPENDENT_AMBULATORY_CARE_PROVIDER_SITE_OTHER): Payer: Medicare Other | Admitting: Surgery

## 2019-04-10 ENCOUNTER — Other Ambulatory Visit: Payer: Self-pay

## 2019-04-10 ENCOUNTER — Encounter: Payer: Self-pay | Admitting: Surgery

## 2019-04-10 VITALS — BP 156/68 | HR 56 | Temp 97.3°F | Resp 20 | Ht 65.0 in | Wt 144.0 lb

## 2019-04-10 DIAGNOSIS — I714 Abdominal aortic aneurysm, without rupture, unspecified: Secondary | ICD-10-CM

## 2019-04-10 NOTE — Progress Notes (Signed)
Vascular and Vein Specialist of [place]  Patient name: Dustin Vaughan MRN: [id number] DOB: 1932/10/09 Sex: male   REASON FOR VISIT:    Follow up  HISOTRY OF PRESENT ILLNESS:    Dustin Vaughan a 86 y.o.male, who isback to discuss his testing results.  The patient has seen Dr. Maryjean Morn in [facility] in the past. He recently had an abdominal ultrasound which showed a 5.8 cm aneurysm. He denies any abdominal pain.  I sent him for a CT scan to better evaluate his AAA   The patient has undergone a right nephrectomy in the past for renal cell cancer. He is medically managed for hypertension. He is a current smoker. He is on a statin for hypercholesterolemia. He is on chronic anticoagulation. He has chronic renal insufficiency. His most recent creatinine was in the 2.3 range.  He recently describes 50 pound weight loss. He is undergone upper endoscopy, but not lower endoscopy. He also reports blood in his stool.  PAST MEDICAL HISTORY:   Past Medical History:  Diagnosis Date  . Aneurysm, aortic ([place])   . Cancer of kidney ([place]) 1997   right removed   . Cataract   . COPD (chronic obstructive pulmonary disease) ([place])   . Coronary artery disease   . Hemorrhoids, external without complications   . High blood pressure   . HTN (hypertension)   . Hypercholesteremia   . Hyperlipidemia   . Osteoarthritis   . Pneumonia   . Prostate cancer ([place])   . Renal insufficiency   . Vitamin D deficiency   . Weight loss      FAMILY HISTORY:   Family History  Problem Relation Age of Onset  . Diabetes Mother   . Heart disease Father   . Heart failure Father   . Hyperlipidemia Father   . Hypertension Father   . Heart attack Father   . Stroke Father   . Colon cancer Neg Hx     SOCIAL HISTORY:   Social History   Tobacco Use  . Smoking status: Current Every Day Smoker    Packs/day: 0.50    Types: Cigarettes  . Smokeless  tobacco: Never Used  Substance Use Topics  . Alcohol use: Not Currently    Comment: quit over a year ago     ALLERGIES:   No Known Allergies   CURRENT MEDICATIONS:   Current Outpatient Medications  Medication Sig Dispense Refill  . amLODipine (NORVASC) 10 MG tablet Take 10 mg by mouth daily.    Marland Kitchen apixaban (ELIQUIS) 2.5 MG TABS tablet Take 1 tablet (2.5 mg total) by mouth 2 (two) times daily. 60 tablet 0  . aspirin EC 81 MG tablet Take 81 mg by mouth daily.    . calcium carbonate (CALCIUM 600) 1500 (600 Ca) MG TABS tablet Take 1 capsule by mouth daily.    . Coenzyme Q-10 200 MG CAPS Take 200 mg by mouth daily.    Marland Kitchen levothyroxine (SYNTHROID, LEVOTHROID) 25 MCG tablet Take 25 mcg by mouth daily.    . metoprolol succinate (TOPROL-XL) 50 MG 24 hr tablet Take 50 mg by mouth 2 (two) times daily.    Marland Kitchen omeprazole (PRILOSEC) 20 MG capsule Take 1 capsule (20 mg total) by mouth daily. 30 capsule 3  . patiromer (VELTASSA) 8.4 g packet Take 8.4 g by mouth daily.    . simvastatin (ZOCOR) 20 MG tablet Take 20 mg by mouth 2 (two) times daily.     . sodium bicarbonate 650  MG tablet Take 650 mg by mouth 4 (four) times daily.     No current facility-administered medications for this visit.    REVIEW OF SYSTEMS:   [X]  denotes positive finding, [ ]  denotes negative finding Cardiac  Comments:  Chest pain or chest pressure:    Shortness of breath upon exertion:    Short of breath when lying flat:    Irregular heart rhythm:        Vascular    Pain in calf, thigh, or hip brought on by ambulation:    Pain in feet at night that wakes you up from your sleep:     Blood clot in your veins:    Leg swelling:         Pulmonary    Oxygen at home:    Productive cough:     Wheezing:         Neurologic    Sudden weakness in arms or legs:     Sudden numbness in arms or legs:     Sudden onset of difficulty speaking or slurred speech:    Temporary loss of vision in one eye:     Problems with  dizziness:         Gastrointestinal    Blood in stool:     Vomited blood:         Genitourinary    Burning when urinating:     Blood in urine:        Psychiatric    Major depression:         Hematologic    Bleeding problems:    Problems with blood clotting too easily:        Skin    Rashes or ulcers:        Constitutional    Fever or chills:      PHYSICAL EXAM:   Vitals:   04/10/19 1356  BP: (!) 156/68  Pulse: (!) 56  Resp: 20  Temp: (!) 97.3 F (36.3 C)  SpO2: 95%  Weight: 144 lb (65.3 kg)  Height: 5\' 5"  (1.651 m)    GENERAL: The patient is a well-nourished male, in no acute distress. The vital signs are documented above. CARDIAC: There is a regular rate and rhythm.  VASCULAR: Palpable femoral pulses PULMONARY: Non-labored respirations ABDOMEN: Soft and non-tender  MUSCULOSKELETAL: There are no major deformities or cyanosis. NEUROLOGIC: No focal weakness or paresthesias are detected. SKIN: There are no ulcers or rashes noted. PSYCHIATRIC: The patient has a normal affect.  STUDIES:   I have reviewed his CT scan with the following findings: 1. Enlarging infrarenal abdominal aortic aneurysm with a proximal fusiform configuration with superimposed saccular aneurysmal segment just proximal to the bifurcation with a maximal diameter of up to 5.6 cm Recommend followup by abdomen and pelvis CTA in 3-6 months, and vascular surgery referral/consultation if not already obtained. Vascular surgery consultation recommended due to increased risk of rupture for AAA >5.5 cm. This recommendation follows ACR consensus guidelines: White Paper of the ACR Incidental Findings Committee II on Vascular Findings. J Am Coll Radiol 2013; 10:789-794. Aortic aneurysm NOS (ICD10-I71.9). 2. 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 Extension into the proximal left iliac artery measuring up to 3.2 cm. 3.  Aortic Atherosclerosis (ICD10-I70.0). 4.  Prior right nephrectomy. No abnormal soft tissue attenuation in the nephrectomy bed. 5. Redemonstration of the multi cystic change of the left kidney with compensatory hypertrophy. Several new hyperdense cystic lesions likely reflect  hemorrhagic cysts. Could consider further evaluation with renal ultrasound. 6. Question mild rugal fold thickening of the gastric antrum. This finding can be seen with gastritis. 7. Colonic diverticulosis without evidence of diverticulitis.   Carotid duplex:  Right Carotid: Velocities in the right ICA are consistent with a 1-39%  stenosis.   Left Carotid: Velocities in the left ICA are consistent with a 1-39%  stenosis.  Normal ABIs with triphasic waveforms  MEDICAL ISSUES:   AAA: This is a very difficult situation because of the patient's renal function.  His most recent creatinine was 3.5.  I discussed with him that he has a very high risk for worsening renal function possibly even needing dialysis with endovascular pair.  However, in 3 months the maximum aortic diameter has gone from 5.2-5.6.  I think with the point where he needs to consider repair if he is going to do this.  At this point he would like to proceed.  First I am going to schedule him for abdominal aortogram with CO2 to better define his anatomy.  After this we will schedule him for endovascular aneurysm repair if he is a candidate.His diagnostic arteriogram is scheduled for Tuesday, March 23.    Leia Alf, MD, FACS Vascular and Vein Specialists of [facility] [phone] Pager [phone]

## 2019-04-10 NOTE — H&P (View-Only) (Signed)
Vascular and Vein Specialist of   Patient name: Dustin Vaughan MRN: [id number] DOB: 04/24/1932 Sex: male   REASON FOR VISIT:    Follow up  HISOTRY OF PRESENT ILLNESS:    Dustin Vaughan a 86 y.o.male, who isback to discuss his testing results.  The patient has seen Dr. Maryjean Morn in [facility] in the past. He recently had an abdominal ultrasound which showed a 5.8 cm aneurysm. He denies any abdominal pain.  I sent him for a CT scan to better evaluate his AAA   The patient has undergone a right nephrectomy in the past for renal cell cancer. He is medically managed for hypertension. He is a current smoker. He is on a statin for hypercholesterolemia. He is on chronic anticoagulation. He has chronic renal insufficiency. His most recent creatinine was in the 2.3 range.  He recently describes 50 pound weight loss. He is undergone upper endoscopy, but not lower endoscopy. He also reports blood in his stool.  PAST MEDICAL HISTORY:   Past Medical History:  Diagnosis Date  . Aneurysm, aortic ([place])   . Cancer of kidney ([place]) 1997   right removed   . Cataract   . COPD (chronic obstructive pulmonary disease) ([place])   . Coronary artery disease   . Hemorrhoids, external without complications   . High blood pressure   . HTN (hypertension)   . Hypercholesteremia   . Hyperlipidemia   . Osteoarthritis   . Pneumonia   . Prostate cancer ([place])   . Renal insufficiency   . Vitamin D deficiency   . Weight loss      FAMILY HISTORY:   Family History  Problem Relation Age of Onset  . Diabetes Mother   . Heart disease Father   . Heart failure Father   . Hyperlipidemia Father   . Hypertension Father   . Heart attack Father   . Stroke Father   . Colon cancer Neg Hx     SOCIAL HISTORY:   Social History   Tobacco Use  . Smoking status: Current Every Day Smoker    Packs/day: 0.50    Types: Cigarettes  . Smokeless  tobacco: Never Used  Substance Use Topics  . Alcohol use: Not Currently    Comment: quit over a year ago     ALLERGIES:   No Known Allergies   CURRENT MEDICATIONS:   Current Outpatient Medications  Medication Sig Dispense Refill  . amLODipine (NORVASC) 10 MG tablet Take 10 mg by mouth daily.    Marland Kitchen apixaban (ELIQUIS) 2.5 MG TABS tablet Take 1 tablet (2.5 mg total) by mouth 2 (two) times daily. 60 tablet 0  . aspirin EC 81 MG tablet Take 81 mg by mouth daily.    . calcium carbonate (CALCIUM 600) 1500 (600 Ca) MG TABS tablet Take 1 capsule by mouth daily.    . Coenzyme Q-10 200 MG CAPS Take 200 mg by mouth daily.    Marland Kitchen levothyroxine (SYNTHROID, LEVOTHROID) 25 MCG tablet Take 25 mcg by mouth daily.    . metoprolol succinate (TOPROL-XL) 50 MG 24 hr tablet Take 50 mg by mouth 2 (two) times daily.    Marland Kitchen omeprazole (PRILOSEC) 20 MG capsule Take 1 capsule (20 mg total) by mouth daily. 30 capsule 3  . patiromer (VELTASSA) 8.4 g packet Take 8.4 g by mouth daily.    . simvastatin (ZOCOR) 20 MG tablet Take 20 mg by mouth 2 (two) times daily.     . sodium bicarbonate 650  MG tablet Take 650 mg by mouth 4 (four) times daily.     No current facility-administered medications for this visit.    REVIEW OF SYSTEMS:   [X]  denotes positive finding, [ ]  denotes negative finding Cardiac  Comments:  Chest pain or chest pressure:    Shortness of breath upon exertion:    Short of breath when lying flat:    Irregular heart rhythm:        Vascular    Pain in calf, thigh, or hip brought on by ambulation:    Pain in feet at night that wakes you up from your sleep:     Blood clot in your veins:    Leg swelling:         Pulmonary    Oxygen at home:    Productive cough:     Wheezing:         Neurologic    Sudden weakness in arms or legs:     Sudden numbness in arms or legs:     Sudden onset of difficulty speaking or slurred speech:    Temporary loss of vision in one eye:     Problems with  dizziness:         Gastrointestinal    Blood in stool:     Vomited blood:         Genitourinary    Burning when urinating:     Blood in urine:        Psychiatric    Major depression:         Hematologic    Bleeding problems:    Problems with blood clotting too easily:        Skin    Rashes or ulcers:        Constitutional    Fever or chills:      PHYSICAL EXAM:   Vitals:   04/10/19 1356  BP: (!) 156/68  Pulse: (!) 56  Resp: 20  Temp: (!) 97.3 F (36.3 C)  SpO2: 95%  Weight: 144 lb (65.3 kg)  Height: 5\' 5"  (1.651 m)    GENERAL: The patient is a well-nourished male, in no acute distress. The vital signs are documented above. CARDIAC: There is a regular rate and rhythm.  VASCULAR: Palpable femoral pulses PULMONARY: Non-labored respirations ABDOMEN: Soft and non-tender  MUSCULOSKELETAL: There are no major deformities or cyanosis. NEUROLOGIC: No focal weakness or paresthesias are detected. SKIN: There are no ulcers or rashes noted. PSYCHIATRIC: The patient has a normal affect.  STUDIES:   I have reviewed his CT scan with the following findings: 1. Enlarging infrarenal abdominal aortic aneurysm with a proximal fusiform configuration with superimposed saccular aneurysmal segment just proximal to the bifurcation with a maximal diameter of up to 5.6 cm Recommend followup by abdomen and pelvis CTA in 3-6 months, and vascular surgery referral/consultation if not already obtained. Vascular surgery consultation recommended due to increased risk of rupture for AAA >5.5 cm. This recommendation follows ACR consensus guidelines: White Paper of the ACR Incidental Findings Committee II on Vascular Findings. J Am Coll Radiol 2013; 10:789-794. Aortic aneurysm NOS (ICD10-I71.9). 2. 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 Extension into the proximal left iliac artery measuring up to 3.2 cm. 3.  Aortic Atherosclerosis (ICD10-I70.0). 4.  Prior right nephrectomy. No abnormal soft tissue attenuation in the nephrectomy bed. 5. Redemonstration of the multi cystic change of the left kidney with compensatory hypertrophy. Several new hyperdense cystic lesions likely reflect  hemorrhagic cysts. Could consider further evaluation with renal ultrasound. 6. Question mild rugal fold thickening of the gastric antrum. This finding can be seen with gastritis. 7. Colonic diverticulosis without evidence of diverticulitis.   Carotid duplex:  Right Carotid: Velocities in the right ICA are consistent with a 1-39%  stenosis.   Left Carotid: Velocities in the left ICA are consistent with a 1-39%  stenosis.  Normal ABIs with triphasic waveforms  MEDICAL ISSUES:   AAA: This is a very difficult situation because of the patient's renal function.  His most recent creatinine was 3.5.  I discussed with him that he has a very high risk for worsening renal function possibly even needing dialysis with endovascular pair.  However, in 3 months the maximum aortic diameter has gone from 5.2-5.6.  I think with the point where he needs to consider repair if he is going to do this.  At this point he would like to proceed.  First I am going to schedule him for abdominal aortogram with CO2 to better define his anatomy.  After this we will schedule him for endovascular aneurysm repair if he is a candidate.His diagnostic arteriogram is scheduled for Tuesday, March 23.    Leia Alf, MD, FACS Vascular and Vein Specialists of [facility] [phone] Pager [phone]

## 2019-04-17 ENCOUNTER — Other Ambulatory Visit (HOSPITAL_COMMUNITY)
Admission: RE | Admit: 2019-04-17 | Discharge: 2019-04-17 | Disposition: A | Discharge status: Home / Self Care | Payer: Medicare Other | Source: Ambulatory Visit | Attending: Surgery | Admitting: Surgery

## 2019-04-17 DIAGNOSIS — Z20822 Contact with and (suspected) exposure to covid-19: Secondary | ICD-10-CM | POA: Diagnosis not present

## 2019-04-17 DIAGNOSIS — Z01812 Encounter for preprocedural laboratory examination: Secondary | ICD-10-CM | POA: Insufficient documentation

## 2019-04-17 LAB — SARS CORONAVIRUS 2 (TAT 6-24 HRS): SARS Coronavirus 2: NEGATIVE

## 2019-04-18 ENCOUNTER — Other Ambulatory Visit: Payer: Self-pay

## 2019-04-18 ENCOUNTER — Encounter (HOSPITAL_COMMUNITY)
Admission: RE | Disposition: A | Discharge status: Home / Self Care | Payer: Self-pay | Source: Home / Self Care | Attending: Surgery

## 2019-04-18 ENCOUNTER — Ambulatory Visit (HOSPITAL_COMMUNITY)
Admission: RE | Admit: 2019-04-18 | Discharge: 2019-04-18 | Disposition: A | Discharge status: Home / Self Care | Payer: Medicare Other | Attending: Surgery | Admitting: Surgery

## 2019-04-18 DIAGNOSIS — Z905 Acquired absence of kidney: Secondary | ICD-10-CM | POA: Diagnosis not present

## 2019-04-18 DIAGNOSIS — E785 Hyperlipidemia, unspecified: Secondary | ICD-10-CM | POA: Diagnosis not present

## 2019-04-18 DIAGNOSIS — E7800 Pure hypercholesterolemia, unspecified: Secondary | ICD-10-CM | POA: Diagnosis not present

## 2019-04-18 DIAGNOSIS — Z7982 Long term (current) use of aspirin: Secondary | ICD-10-CM | POA: Diagnosis not present

## 2019-04-18 DIAGNOSIS — Z79899 Other long term (current) drug therapy: Secondary | ICD-10-CM | POA: Insufficient documentation

## 2019-04-18 DIAGNOSIS — I2510 Atherosclerotic heart disease of native coronary artery without angina pectoris: Secondary | ICD-10-CM | POA: Diagnosis not present

## 2019-04-18 DIAGNOSIS — I129 Hypertensive chronic kidney disease with stage 1 through stage 4 chronic kidney disease, or unspecified chronic kidney disease: Secondary | ICD-10-CM | POA: Diagnosis not present

## 2019-04-18 DIAGNOSIS — N189 Chronic kidney disease, unspecified: Secondary | ICD-10-CM | POA: Diagnosis not present

## 2019-04-18 DIAGNOSIS — E559 Vitamin D deficiency, unspecified: Secondary | ICD-10-CM | POA: Insufficient documentation

## 2019-04-18 DIAGNOSIS — Z85528 Personal history of other malignant neoplasm of kidney: Secondary | ICD-10-CM | POA: Insufficient documentation

## 2019-04-18 DIAGNOSIS — Z7901 Long term (current) use of anticoagulants: Secondary | ICD-10-CM | POA: Insufficient documentation

## 2019-04-18 DIAGNOSIS — K5730 Diverticulosis of large intestine without perforation or abscess without bleeding: Secondary | ICD-10-CM | POA: Diagnosis not present

## 2019-04-18 DIAGNOSIS — F17210 Nicotine dependence, cigarettes, uncomplicated: Secondary | ICD-10-CM | POA: Insufficient documentation

## 2019-04-18 DIAGNOSIS — Z8546 Personal history of malignant neoplasm of prostate: Secondary | ICD-10-CM | POA: Insufficient documentation

## 2019-04-18 DIAGNOSIS — I714 Abdominal aortic aneurysm, without rupture: Secondary | ICD-10-CM | POA: Insufficient documentation

## 2019-04-18 DIAGNOSIS — I700 Atherosclerosis of aorta: Secondary | ICD-10-CM | POA: Insufficient documentation

## 2019-04-18 DIAGNOSIS — J449 Chronic obstructive pulmonary disease, unspecified: Secondary | ICD-10-CM | POA: Insufficient documentation

## 2019-04-18 DIAGNOSIS — N184 Chronic kidney disease, stage 4 (severe): Secondary | ICD-10-CM | POA: Diagnosis not present

## 2019-04-18 DIAGNOSIS — E875 Hyperkalemia: Secondary | ICD-10-CM | POA: Diagnosis not present

## 2019-04-18 HISTORY — PX: ABDOMINAL AORTOGRAM W/LOWER EXTREMITY: CATH118223

## 2019-04-18 LAB — POCT I-STAT, CHEM 8
BUN: 80 mg/dL — ABNORMAL HIGH (ref 8–23)
BUN: 50 mg/dL — ABNORMAL HIGH (ref 8–23)
Calcium, Ion: 1.26 mmol/L (ref 1.15–1.40)
Calcium, Ion: 1.29 mmol/L (ref 1.15–1.40)
Chloride: 116 mmol/L — ABNORMAL HIGH (ref 98–111)
Chloride: 116 mmol/L — ABNORMAL HIGH (ref 98–111)
Creatinine, Ser: 3.8 mg/dL — ABNORMAL HIGH (ref 0.61–1.24)
Creatinine, Ser: 3.9 mg/dL — ABNORMAL HIGH (ref 0.61–1.24)
Glucose, Bld: 87 mg/dL (ref 70–99)
Glucose, Bld: 78 mg/dL (ref 70–99)
HCT: 37 % — ABNORMAL LOW (ref 39.0–52.0)
HCT: 34 % — ABNORMAL LOW (ref 39.0–52.0)
Hemoglobin: 12.6 g/dL — ABNORMAL LOW (ref 13.0–17.0)
Hemoglobin: 11.6 g/dL — ABNORMAL LOW (ref 13.0–17.0)
Potassium: 6.5 mmol/L (ref 3.5–5.1)
Potassium: 5.3 mmol/L — ABNORMAL HIGH (ref 3.5–5.1)
Sodium: 138 mmol/L (ref 135–145)
Sodium: 140 mmol/L (ref 135–145)
TCO2: 21 mmol/L — ABNORMAL LOW (ref 22–32)
TCO2: 19 mmol/L — ABNORMAL LOW (ref 22–32)

## 2019-04-18 LAB — BASIC METABOLIC PANEL
Anion gap: 8 (ref 5–15)
Anion gap: 8 (ref 5–15)
BUN: 59 mg/dL — ABNORMAL HIGH (ref 8–23)
BUN: 57 mg/dL — ABNORMAL HIGH (ref 8–23)
CO2: 18 mmol/L — ABNORMAL LOW (ref 22–32)
CO2: 20 mmol/L — ABNORMAL LOW (ref 22–32)
Calcium: 9.4 mg/dL (ref 8.9–10.3)
Calcium: 8.7 mg/dL — ABNORMAL LOW (ref 8.9–10.3)
Chloride: 113 mmol/L — ABNORMAL HIGH (ref 98–111)
Chloride: 111 mmol/L (ref 98–111)
Creatinine, Ser: 3.74 mg/dL — ABNORMAL HIGH (ref 0.61–1.24)
Creatinine, Ser: 3.45 mg/dL — ABNORMAL HIGH (ref 0.61–1.24)
GFR calc Af Amer: 16 mL/min — ABNORMAL LOW (ref >60)
GFR calc Af Amer: 18 mL/min — ABNORMAL LOW (ref >60)
GFR calc non Af Amer: 14 mL/min — ABNORMAL LOW (ref >60)
GFR calc non Af Amer: 15 mL/min — ABNORMAL LOW (ref >60)
Glucose, Bld: 96 mg/dL (ref 70–99)
Glucose, Bld: 142 mg/dL — ABNORMAL HIGH (ref 70–99)
Potassium: 6.2 mmol/L — ABNORMAL HIGH (ref 3.5–5.1)
Potassium: 5.3 mmol/L — ABNORMAL HIGH (ref 3.5–5.1)
Sodium: 139 mmol/L (ref 135–145)
Sodium: 139 mmol/L (ref 135–145)

## 2019-04-18 SURGERY — ABDOMINAL AORTOGRAM W/LOWER EXTREMITY
Anesthesia: LOCAL | Laterality: Bilateral

## 2019-04-18 MED ORDER — SODIUM CHLORIDE 0.9 % IV SOLN: 250.0000 mL | INTRAVENOUS | Status: DC | PRN

## 2019-04-18 MED ORDER — HEPARIN (PORCINE) IN NACL 1000-0.9 UT/500ML-% IV SOLN
INTRAVENOUS | Status: DC | PRN
  Administered 2019-04-18 (×2): 500 mL

## 2019-04-18 MED ORDER — DEXTROSE 50 % IV SOLN
INTRAVENOUS | Status: DC | PRN
  Administered 2019-04-18: 25 mL via INTRAVENOUS

## 2019-04-18 MED ORDER — FENTANYL CITRATE (PF) 100 MCG/2ML IJ SOLN
INTRAMUSCULAR | Status: AC
  Filled 2019-04-18: qty 2

## 2019-04-18 MED ORDER — DEXTROSE 50 % IV SOLN
1.0000 | Freq: Once | INTRAVENOUS | Status: AC
  Administered 2019-04-18: 50 mL via INTRAVENOUS
  Filled 2019-04-18: qty 50

## 2019-04-18 MED ORDER — HEPARIN (PORCINE) IN NACL 1000-0.9 UT/500ML-% IV SOLN
INTRAVENOUS | Status: AC
  Filled 2019-04-18: qty 500

## 2019-04-18 MED ORDER — DEXTROSE 50 % IV SOLN
INTRAVENOUS | Status: AC
  Filled 2019-04-18: qty 50

## 2019-04-18 MED ORDER — SODIUM ZIRCONIUM CYCLOSILICATE 10 G PO PACK
10.0000 g | PACK | Freq: Once | ORAL | Status: AC
  Administered 2019-04-18: 10 g via ORAL
  Filled 2019-04-18: qty 1

## 2019-04-18 MED ORDER — INSULIN ASPART 100 UNIT/ML ~~LOC~~ SOLN
SUBCUTANEOUS | Status: AC
  Filled 2019-04-18: qty 1

## 2019-04-18 MED ORDER — SODIUM CHLORIDE 0.9 % IV SOLN: INTRAVENOUS | Status: DC

## 2019-04-18 MED ORDER — MIDAZOLAM HCL 2 MG/2ML IJ SOLN
INTRAMUSCULAR | Status: DC | PRN
  Administered 2019-04-18: 1 mg via INTRAVENOUS

## 2019-04-18 MED ORDER — IODIXANOL 320 MG/ML IV SOLN
INTRAVENOUS | Status: DC | PRN
  Administered 2019-04-18: 4 mL

## 2019-04-18 MED ORDER — ACETAMINOPHEN 325 MG PO TABS: 650.0000 mg | ORAL_TABLET | ORAL | Status: DC | PRN

## 2019-04-18 MED ORDER — SODIUM CHLORIDE 0.9% FLUSH: 3.0000 mL | Freq: Two times a day (BID) | INTRAVENOUS | Status: DC

## 2019-04-18 MED ORDER — INSULIN ASPART 100 UNIT/ML ~~LOC~~ SOLN
SUBCUTANEOUS | Status: DC | PRN
  Administered 2019-04-18: 10 [IU] via SUBCUTANEOUS

## 2019-04-18 MED ORDER — LIDOCAINE HCL (PF) 1 % IJ SOLN
INTRAMUSCULAR | Status: DC | PRN
  Administered 2019-04-18: 15 mL via INTRADERMAL

## 2019-04-18 MED ORDER — MIDAZOLAM HCL 2 MG/2ML IJ SOLN
INTRAMUSCULAR | Status: AC
  Filled 2019-04-18: qty 2

## 2019-04-18 MED ORDER — MORPHINE SULFATE (PF) 10 MG/ML IV SOLN: 2.0000 mg | INTRAVENOUS | Status: DC | PRN

## 2019-04-18 MED ORDER — LABETALOL HCL 5 MG/ML IV SOLN: 10.0000 mg | INTRAVENOUS | Status: DC | PRN

## 2019-04-18 MED ORDER — LIDOCAINE HCL (PF) 1 % IJ SOLN
INTRAMUSCULAR | Status: AC
  Filled 2019-04-18: qty 30

## 2019-04-18 MED ORDER — OXYCODONE HCL 5 MG PO TABS: 5.0000 mg | ORAL_TABLET | ORAL | Status: DC | PRN

## 2019-04-18 MED ORDER — INSULIN ASPART 100 UNIT/ML IV SOLN
10.0000 [IU] | Freq: Once | INTRAVENOUS | Status: AC
  Administered 2019-04-18: 10 [IU] via INTRAVENOUS
  Filled 2019-04-18: qty 0.1

## 2019-04-18 MED ORDER — FENTANYL CITRATE (PF) 100 MCG/2ML IJ SOLN
INTRAMUSCULAR | Status: DC | PRN
  Administered 2019-04-18: 50 ug via INTRAVENOUS

## 2019-04-18 MED ORDER — SODIUM CHLORIDE 0.9% FLUSH: 3.0000 mL | INTRAVENOUS | Status: DC | PRN

## 2019-04-18 MED ORDER — HYDRALAZINE HCL 20 MG/ML IJ SOLN: 5.0000 mg | INTRAMUSCULAR | Status: DC | PRN

## 2019-04-18 MED ORDER — ONDANSETRON HCL 4 MG/2ML IJ SOLN: 4.0000 mg | Freq: Four times a day (QID) | INTRAMUSCULAR | Status: DC | PRN

## 2019-04-18 SURGICAL SUPPLY — 17 items
CATH ACCU-VU SIZ PIG 5F 70CM (CATHETERS) ×1 IMPLANT
CATH SOFT-VU 4F 65 STRAIGHT (CATHETERS) IMPLANT
CATH SOFT-VU STRAIGHT 4F 65CM (CATHETERS) ×2
CATH SOFTOUCH MOTARJEME 5F (CATHETERS) ×1 IMPLANT
CLOSURE MYNX CONTROL 5F (Vascular Products) ×1 IMPLANT
DRAPE ZERO GRAVITY STERILE (DRAPES) ×1 IMPLANT
FILTER CO2 0.2 MICRON (VASCULAR PRODUCTS) ×1 IMPLANT
KIT MICROPUNCTURE NIT STIFF (SHEATH) ×1 IMPLANT
KIT PV (KITS) ×2 IMPLANT
RESERVOIR CO2 (VASCULAR PRODUCTS) ×1 IMPLANT
SET FLUSH CO2 (MISCELLANEOUS) ×1 IMPLANT
SHEATH PINNACLE 5F 10CM (SHEATH) ×1 IMPLANT
SHEATH PROBE COVER 6X72 (BAG) ×1 IMPLANT
SYR MEDRAD MARK V 150ML (SYRINGE) IMPLANT
TRANSDUCER W/STOPCOCK (MISCELLANEOUS) ×2 IMPLANT
TRAY PV CATH (CUSTOM PROCEDURE TRAY) ×2 IMPLANT
WIRE BENTSON .035X145CM (WIRE) ×1 IMPLANT

## 2019-04-18 NOTE — Op Note (Signed)
    Patient name: Dustin Vaughan MRN: [id number] DOB: 1932/03/17 Sex: male  04/18/2019 Pre-operative Diagnosis: AAA Post-operative diagnosis:  Same Surgeon:  Annamarie Major Procedure Performed:  1.  Ultrasound-guided access, right femoral artery  2.  Abdominal gram with CO2, bifemoral runoff  3.  First order catheterization  4.  Conscious sedation, 35 minutes  5.  Closure device, Mynx    Indications: The patient has a infrarenal abdominal aortic aneurysm requiring repair.  Because of his renal insufficiency, I was unable to get a contrasted CT scan so he is here today to help define his anatomy.  Procedure:  The patient was identified in the holding area and taken to room 8.  The patient was then placed supine on the table and prepped and draped in the usual sterile fashion.  A time out was called.  Conscious sedation was administered with the use of IV fentanyl and Versed under continuous physician and nurse monitoring.  Heart rate, blood pressure, and oxygen saturation were continuously monitored.  Total sedation time was 35 minutes.  Ultrasound was used to evaluate the right common femoral artery.  It was patent .  A digital ultrasound image was acquired.  A micropuncture needle was used to access the right common femoral artery under ultrasound guidance.  An 018 wire was advanced without resistance and a micropuncture sheath was placed.  The 018 wire was removed and a benson wire was placed.  The micropuncture sheath was exchanged for a 5 french sheath.  An omniflush catheter was advanced over the wire to the level of L-1.  An abdominal angiogram with CO2 was obtained.  The catheter was then pulled down to the aortic bifurcation and oblique images were acquired.  I had difficulty visualization of the hypogastric arteries and so I crossed the aortic bifurcation with a Motarjeme catheter followed by a straight catheter into the left common iliac artery and obtained additional images.  I  used a total of 4 cc of contrast.    Findings:   Aortogram: The left renal artery is visualized and appears to be patent.  There is a infrarenal abdominal aortic aneurysm with infrarenal neck.  Bilateral common and external iliac arteries are widely patent as are bilateral hypogastric arteries.   Intervention:  none  Impression:  #1  Infrarenal abdominal aortic aneurysm.  His anatomy was better outlined today with CO2.  Bilateral hypogastric arteries are widely patent.    Theotis Burrow, M.D., [facility] Vascular and Vein Specialists of [place] Office: [phone] Pager:  [phone]

## 2019-04-18 NOTE — Interval H&P Note (Signed)
History and Physical Interval Note:  04/18/2019 9:19 AM  Dustin Vaughan  has presented today for surgery, with the diagnosis of pad.  The various methods of treatment have been discussed with the patient and family. After consideration of risks, benefits and other options for treatment, the patient has consented to  Procedure(s): ABDOMINAL AORTOGRAM W/LOWER EXTREMITY (N/A) as a surgical intervention.  The patient's history has been reviewed, patient examined, no change in status, stable for surgery.  I have reviewed the patient's chart and labs.  Questions were answered to the patient's satisfaction.     Annamarie Major

## 2019-04-18 NOTE — Progress Notes (Signed)
Discharge instructions reviewed with pt and his friend Francee Piccolo (via telephone) Both voice understanding.

## 2019-04-18 NOTE — Progress Notes (Signed)
Rennis Harding, Rn called and informed of pt heart rate being brady rate 38 and in the 40's

## 2019-04-18 NOTE — Consult Note (Signed)
Called to help in management of hyperkalemia on pre op labs for diagnostic CO2 angiogram for AAA today.    Initial labs today showing BUN 80/Cr 3.80, K 6.5. Repeated to confirm, K 6.2, Bicarb 18, BUN 59, Cr 3.74.  Given insulin and dextrose with improvement in K 5.3.  Proceeded to angiogram.    Pt follows with Dr. Posey Pronto at [facility] for CKD 4 with downward trend of renal function but NO plans for HD per pt preference.  He has h/o hyperkalemia and is managed with low K diet and veltassa 8.4g QOD though per notes there is question of variable compliance.    I saw him in post op -- feels fine.  Has veltassa and copy of low K diet.  Says has been following.  No cramps/myalgias or other med changes lately.  Looks euvolemic.   Recommend: --RN to check repeat K prior to d/c and page me with result. Assuming ok he can go.  --Increase home veltassa to daily dosing from QOD --Strict low K diet at home --Labs at local [facility] Monday 3/29, have let office staff know to order.

## 2019-04-18 NOTE — Progress Notes (Signed)
Lab called to check on the BMET  States we are taking care of that right now.

## 2019-04-18 NOTE — Progress Notes (Signed)
Pt states he hasn't taken eliquis in a long time if he even took it at all he states he doesn't remember ever taking

## 2019-04-18 NOTE — Discharge Instructions (Signed)
Femoral Site Care This sheet gives you information about how to care for yourself after your procedure. Your health care provider may also give you more specific instructions. If you have problems or questions, contact your health care provider. What can I expect after the procedure? After the procedure, it is common to have:  Bruising that usually fades within 1-2 weeks.  Tenderness at the site. Follow these instructions at home: Wound care  Follow instructions from your health care provider about how to take care of your insertion site. Make sure you: ? Wash your hands with soap and water before you change your bandage (dressing). If soap and water are not available, use hand sanitizer. ? Change your dressing as told by your health care provider. ? Leave stitches (sutures), skin glue, or adhesive strips in place. These skin closures may need to stay in place for 2 weeks or longer. If adhesive strip edges start to loosen and curl up, you may trim the loose edges. Do not remove adhesive strips completely unless your health care provider tells you to do that.  Do not take baths, swim, or use a hot tub until your health care provider approves.  You may shower 24-48 hours after the procedure or as told by your health care provider. ? Gently wash the site with plain soap and water. ? Pat the area dry with a clean towel. ? Do not rub the site. This may cause bleeding.  Do not apply powder or lotion to the site. Keep the site clean and dry.  Check your femoral site every day for signs of infection. Check for: ? Redness, swelling, or pain. ? Fluid or blood. ? Warmth. ? Pus or a bad smell. Activity  For the first 2-3 days after your procedure, or as long as directed: ? Avoid climbing stairs as much as possible. ? Do not squat.  Do not lift anything that is heavier than 10 lb (4.5 kg), or the limit that you are told, until your health care provider says that it is safe.  Rest as  directed. ? Avoid sitting for a long time without moving. Get up to take short walks every 1-2 hours.  Do not drive for 24 hours if you were given a medicine to help you relax (sedative). General instructions  Take over-the-counter and prescription medicines only as told by your health care provider.  Keep all follow-up visits as told by your health care provider. This is important. Contact a health care provider if you have:  A fever or chills.  You have redness, swelling, or pain around your insertion site. Get help right away if:  The catheter insertion area swells very fast.  You pass out.  You suddenly start to sweat or your skin gets clammy.  The catheter insertion area is bleeding, and the bleeding does not stop when you hold steady pressure on the area.  The area near or just beyond the catheter insertion site becomes pale, cool, tingly, or numb. These symptoms may represent a serious problem that is an emergency. Do not wait to see if the symptoms will go away. Get medical help right away. Call your local emergency services (911 in the [place] Do not drive yourself to the hospital. Summary  After the procedure, it is common to have bruising that usually fades within 1-2 weeks.  Check your femoral site every day for signs of infection.  Do not lift anything that is heavier than 10 lb (4.5 kg), or the   limit that you are told, until your health care provider says that it is safe. This information is not intended to replace advice given to you by your health care provider. Make sure you discuss any questions you have with your health care provider. Document Revised: 01/25/2017 Document Reviewed: 01/25/2017 Elsevier Patient Education  2020 Elsevier Inc.  

## 2019-04-18 NOTE — Progress Notes (Signed)
Place on tele to monitor

## 2019-04-18 NOTE — Progress Notes (Signed)
Lab results called to [place] he can go home

## 2019-05-08 ENCOUNTER — Encounter: Payer: Self-pay | Admitting: Surgery

## 2019-05-08 ENCOUNTER — Other Ambulatory Visit: Payer: Self-pay

## 2019-05-08 ENCOUNTER — Ambulatory Visit (INDEPENDENT_AMBULATORY_CARE_PROVIDER_SITE_OTHER): Payer: Medicare Other | Admitting: Surgery

## 2019-05-08 VITALS — BP 157/76 | HR 43 | Temp 97.9°F | Resp 20 | Ht 66.0 in | Wt 143.1 lb

## 2019-05-08 DIAGNOSIS — I714 Abdominal aortic aneurysm, without rupture, unspecified: Secondary | ICD-10-CM

## 2019-05-08 NOTE — Progress Notes (Addendum)
Vascular and Vein Specialist of [place]  Patient name: Dustin Vaughan MRN: [id number] DOB: 02/10/1932 Sex: male   REASON FOR VISIT:    Follow up AAA  HISOTRY OF PRESENT ILLNESS:    Dustin Vaughan is a 86 y.o. male who comes back today for further discussions regarding his abdominal aortic aneurysm.  Maximum aortic diameter is 5.6.  This is an increase from 5.23 months ago.  He recently underwent CO2 angiogram to better define his anatomy given his chronic renal insufficiency.  He is here today for surgical planning.  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 He is medically managed for hypertension. He is a current smoker. He is on a statin for hypercholesterolemia. He is on chronic anticoagulation. He has chronic renal insufficiency. His most recent creatinine was in the 2.3 range.  He recently describes 50 pound weight loss. He is undergone upper endoscopy, but not lower endoscopy. He also reports blood in his stool. PAST MEDICAL HISTORY:   Past Medical History:  Diagnosis Date  . Aneurysm, aortic ([place])   . Cancer of kidney ([place]) 1997   right removed   . Cataract   . COPD (chronic obstructive pulmonary disease) ([place])   . Coronary artery disease   . Hemorrhoids, external without complications   . High blood pressure   . HTN (hypertension)   . Hypercholesteremia   . Hyperlipidemia   . Osteoarthritis   . Pneumonia   . Prostate cancer ([place])   . Renal insufficiency   . Vitamin D deficiency   . Weight loss      FAMILY HISTORY:   Family History  Problem Relation Age of Onset  . Diabetes Mother   . Heart disease Father   . Heart failure Father   . Hyperlipidemia Father   . Hypertension Father   . Heart attack Father   . Stroke Father   . Colon cancer Neg Hx     SOCIAL HISTORY:   Social History   Tobacco Use  . Smoking status: Current Every Day Smoker   Packs/day: 0.25    Types: Cigarettes  . Smokeless tobacco: Never Used  Substance Use Topics  . Alcohol use: Not Currently    Comment: quit over a year ago     ALLERGIES:   No Known Allergies   CURRENT MEDICATIONS:   Current Outpatient Medications  Medication Sig Dispense Refill  . amLODipine (NORVASC) 10 MG tablet Take 10 mg by mouth daily.    Marland Kitchen aspirin EC 81 MG tablet Take 81 mg by mouth daily.    Marland Kitchen b complex vitamins tablet Take 1 tablet by mouth daily.    . Calcium Carb-Cholecalciferol (CALCIUM 600+D) 600-800 MG-UNIT TABS Take 1 tablet by mouth daily.    . Cholecalciferol (DIALYVITE VITAMIN D 5000) 125 MCG (5000 UT) capsule Take 10,000 Units by mouth daily.    . Coenzyme Q-10 200 MG CAPS Take 200 mg by mouth daily.    . ferrous sulfate 325 (65 FE) MG tablet Take 325 mg by mouth daily.    . fluticasone (FLONASE) 50 MCG/ACT nasal spray Place 1 spray into both nostrils daily as needed for allergies or rhinitis.    Marland Kitchen levothyroxine (SYNTHROID) 75 MCG tablet Take 37.5 mcg by mouth at bedtime.    . metoprolol tartrate (LOPRESSOR) 50 MG tablet Take 75 mg by mouth 2 (two) times daily.    . Omega-3 Fatty Acids (FISH OIL) 1000 MG CAPS  Take 1,000 mg by mouth daily.    Marland Kitchen omeprazole (PRILOSEC) 20 MG capsule Take 1 capsule (20 mg total) by mouth daily. (Patient taking differently: Take 20 mg by mouth every other day. ) 30 capsule 3  . simvastatin (ZOCOR) 20 MG tablet Take 10 mg by mouth at bedtime.     Marland Kitchen apixaban (ELIQUIS) 2.5 MG TABS tablet Take 1 tablet (2.5 mg total) by mouth 2 (two) times daily. (Patient not taking: Reported on 04/13/2019) 60 tablet 0   No current facility-administered medications for this visit.    REVIEW OF SYSTEMS:   [X]  denotes positive finding, [ ]  denotes negative finding Cardiac  Comments:  Chest pain or chest pressure:    Shortness of breath upon exertion:    Short of breath when lying flat:    Irregular heart rhythm:        Vascular    Pain in calf,  thigh, or hip brought on by ambulation:    Pain in feet at night that wakes you up from your sleep:     Blood clot in your veins:    Leg swelling:         Pulmonary    Oxygen at home:    Productive cough:     Wheezing:         Neurologic    Sudden weakness in arms or legs:     Sudden numbness in arms or legs:     Sudden onset of difficulty speaking or slurred speech:    Temporary loss of vision in one eye:     Problems with dizziness:         Gastrointestinal    Blood in stool:     Vomited blood:         Genitourinary    Burning when urinating:     Blood in urine:        Psychiatric    Major depression:         Hematologic    Bleeding problems:    Problems with blood clotting too easily:        Skin    Rashes or ulcers:        Constitutional    Fever or chills:      PHYSICAL EXAM:   Vitals:   05/08/19 0856 05/08/19 0858  BP: (!) 148/71 (!) 157/76  Pulse: (!) 43   Resp: 20   Temp: 97.9 F (36.6 C)   SpO2: 94%   Weight: 143 lb 1.6 oz (64.9 kg)   Height: 5\' 6"  (1.676 m)     GENERAL: The patient is a well-nourished male, in no acute distress. The vital signs are documented above. CARDIAC: There is a regular rate and rhythm.  PULMONARY: Non-labored respirations ABDOMEN: Soft and non-tender  MUSCULOSKELETAL: There are no major deformities or cyanosis. NEUROLOGIC: No focal weakness or paresthesias are detected. SKIN: There are no ulcers or rashes noted. PSYCHIATRIC: The patient has a normal affect.  STUDIES:   Patient recently underwent CO2 angiography revealing bilateral patent hypogastric arteries.  CT scan shows 5.6 cm infrarenal abdominal aortic aneurysm  MEDICAL ISSUES:   5.6 cmAAA: We discussed proceeding with endovascular repair.  We discussed 3 possibilities, the most likely of which is a iliac branch device on the left.  Also discussed the possibility of coil embolization of the left hypogastric versus a bell bottom.  I discussed the risk and  benefits the operation including the risk of complications of lower extremity ischemia, intestinal  ischemia, stroke, death, blood loss, infection, and wound complications.  We spent a lot of time talking about the possibility of worsening renal function, and even the possibility of requiring dialysis.  The patient has 1 kidney and a creatinine of 3.5.  I will minimize the use of contrast and try to rely on intravascular ultrasound and CO2.  He understands that there is a risk for renal failure but wants to proceed.  I have him scheduled for Friday, April 23  Left popliteal aneurysm: He will need follow-up in 6 months with duplex.  Leia Alf, MD, FACS [facility] Vein Specialists of [facility] [phone] Pager [phone]

## 2019-05-08 NOTE — H&P (View-Only) (Signed)
[facility] [place]  Patient name: Dustin Vaughan MRN: [id number] DOB: [birth year] Sex: male   REASON FOR VISIT:    Follow up AAA  HISOTRY OF PRESENT ILLNESS:    Dustin Vaughan is a 86 y.o. male who comes back today for further discussions regarding his abdominal aortic aneurysm.  Maximum aortic diameter is 5.6.  This is an increase from 5.23 months ago.  He recently underwent CO2 angiogram to better define his anatomy given his chronic renal insufficiency.  He is here today for surgical planning.  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 He is medically managed for hypertension. He is a current smoker. He is on a statin for hypercholesterolemia. He is on chronic anticoagulation. He has chronic renal insufficiency. His most recent creatinine was in the 2.3 range.  He recently describes 50 pound weight loss. He is undergone upper endoscopy, but not lower endoscopy. He also reports blood in his stool. PAST MEDICAL HISTORY:   Past Medical History:  Diagnosis Date  . Aneurysm, aortic ([place])   . Cancer of kidney ([place]) 1997   right removed   . Cataract   . COPD (chronic obstructive pulmonary disease) ([place])   . Coronary artery disease   . Hemorrhoids, external without complications   . High blood pressure   . HTN (hypertension)   . Hypercholesteremia   . Hyperlipidemia   . Osteoarthritis   . Pneumonia   . Prostate cancer ([place])   . Renal insufficiency   . Vitamin D deficiency   . Weight loss      FAMILY HISTORY:   Family History  Problem Relation Age of Onset  . Diabetes Mother   . Heart disease Father   . Heart failure Father   . Hyperlipidemia Father   . Hypertension Father   . Heart attack Father   . Stroke Father   . Colon cancer Neg Hx     SOCIAL HISTORY:   Social History   Tobacco Use  . Smoking status: Current Every Day Smoker   Packs/day: 0.25    Types: Cigarettes  . Smokeless tobacco: Never Used  Substance Use Topics  . Alcohol use: Not Currently    Comment: quit over a year ago     ALLERGIES:   No Known Allergies   CURRENT MEDICATIONS:   Current Outpatient Medications  Medication Sig Dispense Refill  . amLODipine (NORVASC) 10 MG tablet Take 10 mg by mouth daily.    Marland Kitchen aspirin EC 81 MG tablet Take 81 mg by mouth daily.    Marland Kitchen b complex vitamins tablet Take 1 tablet by mouth daily.    . Calcium Carb-Cholecalciferol (CALCIUM 600+D) 600-800 MG-UNIT TABS Take 1 tablet by mouth daily.    . Cholecalciferol (DIALYVITE VITAMIN D 5000) 125 MCG (5000 UT) capsule Take 10,000 Units by mouth daily.    . Coenzyme Q-10 200 MG CAPS Take 200 mg by mouth daily.    . ferrous sulfate 325 (65 FE) MG tablet Take 325 mg by mouth daily.    . fluticasone (FLONASE) 50 MCG/ACT nasal spray Place 1 spray into both nostrils daily as needed for allergies or rhinitis.    Marland Kitchen levothyroxine (SYNTHROID) 75 MCG tablet Take 37.5 mcg by mouth at bedtime.    . metoprolol tartrate (LOPRESSOR) 50 MG tablet Take 75 mg by mouth 2 (two) times daily.    . Omega-3 Fatty Acids (FISH OIL) 1000 MG CAPS  Take 1,000 mg by mouth daily.    Marland Kitchen omeprazole (PRILOSEC) 20 MG capsule Take 1 capsule (20 mg total) by mouth daily. (Patient taking differently: Take 20 mg by mouth every other day. ) 30 capsule 3  . simvastatin (ZOCOR) 20 MG tablet Take 10 mg by mouth at bedtime.     Marland Kitchen apixaban (ELIQUIS) 2.5 MG TABS tablet Take 1 tablet (2.5 mg total) by mouth 2 (two) times daily. (Patient not taking: Reported on 04/13/2019) 60 tablet 0   No current facility-administered medications for this visit.    REVIEW OF SYSTEMS:   [X]  denotes positive finding, [ ]  denotes negative finding Cardiac  Comments:  Chest pain or chest pressure:    Shortness of breath upon exertion:    Short of breath when lying flat:    Irregular heart rhythm:        Vascular    Pain in calf,  thigh, or hip brought on by ambulation:    Pain in feet at night that wakes you up from your sleep:     Blood clot in your veins:    Leg swelling:         Pulmonary    Oxygen at home:    Productive cough:     Wheezing:         Neurologic    Sudden weakness in arms or legs:     Sudden numbness in arms or legs:     Sudden onset of difficulty speaking or slurred speech:    Temporary loss of vision in one eye:     Problems with dizziness:         Gastrointestinal    Blood in stool:     Vomited blood:         Genitourinary    Burning when urinating:     Blood in urine:        Psychiatric    Major depression:         Hematologic    Bleeding problems:    Problems with blood clotting too easily:        Skin    Rashes or ulcers:        Constitutional    Fever or chills:      PHYSICAL EXAM:   Vitals:   05/08/19 0856 05/08/19 0858  BP: (!) 148/71 (!) 157/76  Pulse: (!) 43   Resp: 20   Temp: 97.9 F (36.6 C)   SpO2: 94%   Weight: 143 lb 1.6 oz (64.9 kg)   Height: 5\' 6"  (1.676 m)     GENERAL: The patient is a well-nourished male, in no acute distress. The vital signs are documented above. CARDIAC: There is a regular rate and rhythm.  PULMONARY: Non-labored respirations ABDOMEN: Soft and non-tender  MUSCULOSKELETAL: There are no major deformities or cyanosis. NEUROLOGIC: No focal weakness or paresthesias are detected. SKIN: There are no ulcers or rashes noted. PSYCHIATRIC: The patient has a normal affect.  STUDIES:   Patient recently underwent CO2 angiography revealing bilateral patent hypogastric arteries.  CT scan shows 5.6 cm infrarenal abdominal aortic aneurysm  MEDICAL ISSUES:   5.6 cmAAA: We discussed proceeding with endovascular repair.  We discussed 3 possibilities, the most likely of which is a iliac branch device on the left.  Also discussed the possibility of coil embolization of the left hypogastric versus a bell bottom.  I discussed the risk and  benefits the operation including the risk of complications of lower extremity ischemia, intestinal  ischemia, stroke, death, blood loss, infection, and wound complications.  We spent a lot of time talking about the possibility of worsening renal function, and even the possibility of requiring dialysis.  The patient has 1 kidney and a creatinine of 3.5.  I will minimize the use of contrast and try to rely on intravascular ultrasound and CO2.  He understands that there is a risk for renal failure but wants to proceed.  I have him scheduled for Friday, April 23  Left popliteal aneurysm: He will need follow-up in 6 months with duplex.  Leia Alf, MD, FACS [facility] [phone] Pager [phone]

## 2019-05-11 DIAGNOSIS — I10 Essential (primary) hypertension: Secondary | ICD-10-CM | POA: Diagnosis not present

## 2019-05-11 DIAGNOSIS — E039 Hypothyroidism, unspecified: Secondary | ICD-10-CM | POA: Diagnosis not present

## 2019-05-11 DIAGNOSIS — E559 Vitamin D deficiency, unspecified: Secondary | ICD-10-CM | POA: Diagnosis not present

## 2019-05-11 DIAGNOSIS — E785 Hyperlipidemia, unspecified: Secondary | ICD-10-CM | POA: Diagnosis not present

## 2019-05-15 ENCOUNTER — Encounter: Payer: Self-pay | Admitting: Cardiology

## 2019-05-15 ENCOUNTER — Ambulatory Visit (INDEPENDENT_AMBULATORY_CARE_PROVIDER_SITE_OTHER): Payer: Medicare Other | Admitting: Cardiology

## 2019-05-15 ENCOUNTER — Other Ambulatory Visit: Payer: Self-pay

## 2019-05-15 VITALS — BP 148/70 | HR 90 | Temp 97.3°F | Ht 66.0 in | Wt 143.0 lb

## 2019-05-15 DIAGNOSIS — I2510 Atherosclerotic heart disease of native coronary artery without angina pectoris: Secondary | ICD-10-CM

## 2019-05-15 DIAGNOSIS — I714 Abdominal aortic aneurysm, without rupture, unspecified: Secondary | ICD-10-CM

## 2019-05-15 DIAGNOSIS — I10 Essential (primary) hypertension: Secondary | ICD-10-CM

## 2019-05-15 DIAGNOSIS — Z01810 Encounter for preprocedural cardiovascular examination: Secondary | ICD-10-CM

## 2019-05-15 DIAGNOSIS — E782 Mixed hyperlipidemia: Secondary | ICD-10-CM

## 2019-05-15 HISTORY — DX: Encounter for preprocedural cardiovascular examination: Z01.810

## 2019-05-15 NOTE — Patient Instructions (Addendum)
Medication Instructions:  Your physician has recommended you make the following change in your medication:  Decrease your Metoprolol to 50 mg twice daily.  *If you need a refill on your cardiac medications before your next appointment, please call your pharmacy*   Lab Work: None ordered If you have labs (blood work) drawn today and your tests are completely normal, you will receive your results only by: Marland Kitchen MyChart Message (if you have MyChart) OR . A paper copy in the mail If you have any lab test that is abnormal or we need to change your treatment, we will call you to review the results.   Testing/Procedures: You had an EKG today.   Follow-Up: At [facility], you and your health needs are our priority.  As part of our continuing mission to provide you with exceptional heart care, we have created designated Provider Care Teams.  These Care Teams include your primary Cardiologist (physician) and Advanced Practice Providers (APPs -  Physician Assistants and Nurse Practitioners) who all work together to provide you with the care you need, when you need it.  We recommend signing up for the patient portal called "MyChart".  Sign up information is provided on this After Visit Summary.  MyChart is used to connect with patients for Virtual Visits (Telemedicine).  Patients are able to view lab/test results, encounter notes, upcoming appointments, etc.  Non-urgent messages can be sent to your provider as well.   To learn more about what you can do with MyChart, go to [email or web address].    Your next appointment:   6 month(s)  The format for your next appointment:   In Person  Provider:   Jyl Heinz, MD   Other Instructions NA

## 2019-05-15 NOTE — Progress Notes (Signed)
Cardiology Office Note:    Date:  05/15/2019   ID:  Dustin Vaughan, DOB [birth year], MRN [id number]  PCP:  Nicholos Johns, MD  Cardiologist:  Jenean Lindau, MD   Referring MD: Nicholos Johns, MD    ASSESSMENT:    1. Abdominal aortic aneurysm (AAA) without rupture ([place])   2. Coronary artery disease involving native coronary artery of native heart without angina pectoris   3. Essential hypertension   4. Mixed hyperlipidemia   5. Preoperative cardiovascular examination    PLAN:    In order of problems listed above:  1. Preoperative cardiovascular stratification: Secondary prevention stressed with the patient.  Importance of compliance with diet and medication stressed and he vocalized understanding.  I discussed my findings with the patient extensively and.  His surgery is coming up on Friday.  He has excellent effort tolerance and in view of this I do not think he is at high risk for coronary events during the aforementioned surgery.  He is moderate risk in view of his multiple comorbidities.  Meticulous hemodynamic monitoring and continued perioperative beta-blockade will further reduce the risk of coronary events.  Patient understands this.  EKG done today reveals sinus rhythm and nonspecific ST-T changes.  Sinus bradycardia with first-degree AV block and left anterior fascicular block.  As the patient has significant bradycardia I have cut down his beta-blocker dose and advised him accordingly. 2. Coronary artery disease: Stable at this time with good effort tolerance 3. Essential hypertension: Blood pressure stable.  He is a little concerned about the procedure coming up and he tells me that his blood pressure is mildly elevated because of this. 4. Mixed dyslipidemia: Lipids will be checked after the aforementioned procedure and he settles down with it.  He will be seen in follow-up appointment in 3 months or earlier if he has any concerns.  Recent lipids were reviewed by me from  [facility] lab sheet.  Patient had multiple questions which were answered to his satisfaction.   Medication Adjustments/Labs and Tests Ordered: Current medicines are reviewed at length with the patient today.  Concerns regarding medicines are outlined above.  No orders of the defined types were placed in this encounter.  No orders of the defined types were placed in this encounter.    No chief complaint on file.    History of Present Illness:    Dustin Vaughan is a 86 y.o. male.  Patient has past medical history of coronary artery disease, essential hypertension dyslipidemia.  He has significant and advanced renal insufficiency.  He is planning to undergo aortic aneurysm surgery.  He denies any chest pain orthopnea or PND.  At the time of my evaluation, the patient is alert awake oriented and in no distress.  I asked him about his effort tolerance and he tells me that he walks more than half an hour without any problems.  Past Medical History:  Diagnosis Date  . Aneurysm, aortic ([place])   . Cancer of kidney ([place]) 1997   right removed   . Cataract   . COPD (chronic obstructive pulmonary disease) ([place])   . Coronary artery disease   . Hemorrhoids, external without complications   . High blood pressure   . HTN (hypertension)   . Hypercholesteremia   . Hyperlipidemia   . Osteoarthritis   . Pneumonia   . Prostate cancer ([place])   . Renal insufficiency   . Vitamin D deficiency   . Weight loss  Past Surgical History:  Procedure Laterality Date  . ABDOMINAL AORTOGRAM W/LOWER EXTREMITY Bilateral 04/18/2019   Procedure: ABDOMINAL AORTOGRAM W/LOWER EXTREMITY;  Surgeon: Serafina Mitchell, MD;  Location: [facility];  Service: Cardiovascular;  Laterality: Bilateral;  . ANGIOPLASTY  02/26/2017  . COLONOSCOPY  12/09/2011   Colonic polyp status post polypectomy. [place] predominantly sigmoid diverticulosis. Mid radiationinduced chronic changes in the rectum. Otherwise normal  colonoscopy. Tubular adenoma.   Marland Kitchen KIDNEY SURGERY Right 1997   only have 1 kidney. Left pt has     Current Medications: Current Meds  Medication Sig  . amLODipine (NORVASC) 10 MG tablet Take 10 mg by mouth daily.  Marland Kitchen aspirin EC 81 MG tablet Take 81 mg by mouth daily.  Marland Kitchen b complex vitamins tablet Take 1 tablet by mouth daily.  . Calcium Carb-Cholecalciferol (CALCIUM 600+D) 600-800 MG-UNIT TABS Take 1 tablet by mouth daily.  . cholecalciferol (VITAMIN D3) 25 MCG (1000 UNIT) tablet Take 1,000 Units by mouth daily.  . Coenzyme Q-10 200 MG CAPS Take 200 mg by mouth daily.  . ferrous sulfate 325 (65 FE) MG tablet Take 325 mg by mouth daily.  . fluticasone (FLONASE) 50 MCG/ACT nasal spray Place 1 spray into both nostrils daily as needed for allergies or rhinitis.  Marland Kitchen levothyroxine (SYNTHROID) 75 MCG tablet Take 37.5 mcg by mouth at bedtime.  . metoprolol tartrate (LOPRESSOR) 50 MG tablet Take 75 mg by mouth 2 (two) times daily.  . Omega-3 Fatty Acids (FISH OIL) 1000 MG CAPS Take 1,000 mg by mouth daily.  Marland Kitchen omeprazole (PRILOSEC) 20 MG capsule Take 1 capsule (20 mg total) by mouth daily.  . simvastatin (ZOCOR) 20 MG tablet Take 10 mg by mouth at bedtime.   . vitamin B-12 (CYANOCOBALAMIN) 500 MCG tablet Take 500 mcg by mouth daily.     Allergies:   Patient has no known allergies.   Social History   Socioeconomic History  . Marital status: Married    Spouse name: Not on file  . Number of children: 3  . Years of education: Not on file  . Highest education level: Not on file  Occupational History  . Occupation: Retired  Tobacco Use  . Smoking status: Current Every Day Smoker    Packs/day: 0.25    Types: Cigarettes  . Smokeless tobacco: Never Used  Substance and Sexual Activity  . Alcohol use: Not Currently    Comment: quit over a year ago  . Drug use: No  . Sexual activity: Not on file  Other Topics Concern  . Not on file  Social History Narrative  . Not on file   Social  Determinants of Health   Financial Resource Strain:   . Difficulty of Paying Living Expenses:   Food Insecurity:   . Worried About Charity fundraiser in the Last Year:   . Arboriculturist in the Last Year:   Transportation Needs:   . Film/video editor (Medical):   Marland Kitchen Lack of Transportation (Non-Medical):   Physical Activity:   . Days of Exercise per Week:   . Minutes of Exercise per Session:   Stress:   . Feeling of Stress :   Social Connections:   . Frequency of Communication with Friends and Family:   . Frequency of Social Gatherings with Friends and Family:   . Attends Religious Services:   . Active Member of Clubs or Organizations:   . Attends Archivist Meetings:   Marland Kitchen Marital Status:  Family History: The patient's family history includes Diabetes in his mother; Heart attack in his father; Heart disease in his father; Heart failure in his father; Hyperlipidemia in his father; Hypertension in his father; Stroke in his father. There is no history of Colon cancer.  ROS:   Please see the history of present illness.    All other systems reviewed and are negative.  EKGs/Labs/Other Studies Reviewed:    The following studies were reviewed today: IMPRESSION: 1. Enlarging infrarenal abdominal aortic aneurysm with a proximal fusiform configuration with superimposed saccular aneurysmal segment just proximal to the bifurcation with a maximal diameter of up to 5.6 cm Recommend followup by abdomen and pelvis CTA in 3-6 months, and vascular surgery referral/consultation if not already obtained. Vascular surgery consultation recommended due to increased risk of rupture for AAA >5.5 cm. This recommendation follows ACR consensus guidelines: White Paper of the ACR Incidental Findings Committee II on Vascular Findings. J Am Coll Radiol 2013; 10:789-794. Aortic aneurysm NOS (ICD10-I71.9). 2. 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  Extension into the proximal left iliac artery measuring up to 3.2 cm. 3.  Aortic Atherosclerosis (ICD10-I70.0). 4. Prior right nephrectomy. No abnormal soft tissue attenuation in the nephrectomy bed. 5. Redemonstration of the multi cystic change of the left kidney with compensatory hypertrophy. Several new hyperdense cystic lesions likely reflect hemorrhagic cysts. Could consider further evaluation with renal ultrasound. 6. Question mild rugal fold thickening of the gastric antrum. This finding can be seen with gastritis. 7. Colonic diverticulosis without evidence of diverticulitis.  These results will be called to the ordering clinician or representative by the Radiologist Assistant, and communication documented in the PACS or zVision Dashboard.   Electronically Signed   By: Lovena Le M.D.   On: 03/10/2019 02:20   Recent Labs: 04/18/2019: BUN 57; Creatinine, Ser 3.45; Hemoglobin 11.6; Potassium 5.3; Sodium 139  Recent Lipid Panel    Component Value Date/Time   CHOL 122 04/13/2018 1140   TRIG 111 04/13/2018 1140   HDL 48 04/13/2018 1140   CHOLHDL 2.5 04/13/2018 1140   LDLCALC 52 04/13/2018 1140    Physical Exam:    VS:  BP (!) 148/70   Pulse 90   Temp (!) 97.3 F (36.3 C)   Ht 5\' 6"  (1.676 m)   Wt 143 lb (64.9 kg)   SpO2 100%   BMI 23.08 kg/m     Wt Readings from Last 3 Encounters:  05/15/19 143 lb (64.9 kg)  05/08/19 143 lb 1.6 oz (64.9 kg)  04/18/19 143 lb (64.9 kg)     GEN: Patient is in no acute distress HEENT: Normal NECK: No JVD; No carotid bruits LYMPHATICS: No lymphadenopathy CARDIAC: Hear sounds regular, 2/6 systolic murmur at the apex. RESPIRATORY:  Clear to auscultation without rales, wheezing or rhonchi  ABDOMEN: Soft, non-tender, non-distended MUSCULOSKELETAL:  No edema; No deformity  SKIN: Warm and dry NEUROLOGIC:  Alert and oriented x 3 PSYCHIATRIC:  Normal affect   Signed, Jenean Lindau, MD  05/15/2019 11:02 AM    [place]

## 2019-05-16 NOTE — Addendum Note (Signed)
Addended by: York Cerise C on: 05/16/2019 04:01 PM   Modules accepted: Orders

## 2019-05-16 NOTE — Progress Notes (Signed)
[place], [place] [address] Phone: [phone] Fax: [phone]      Your procedure is scheduled on Friday, May 19, 2019.  Report to [facility] Main Entrance "A" at 5:30 A.M., and check in at the Admitting office.  Call this number if you have problems the morning of surgery:  [phone]  Call [phone] if you have any questions prior to your surgery date Monday-Friday 8am-4pm    Remember:  Do not eat or drink after midnight the night before your surgery     Take these medicines the morning of surgery with A SIP OF WATER:  amLODipine (NORVASC) metoprolol tartrate (LOPRESSOR) omeprazole (PRILOSEC)  fluticasone (FLONASE) - if needed  Follow your surgeon's instructions on when to stop Aspirin.  If no instructions were given by your surgeon then you will need to call the office to get those instructions.     As of today, STOP taking any Aspirin containing products, Aleve, Naproxen, Ibuprofen, Motrin, Advil, Goody's, BC's, all herbal medications, fish oil, and all vitamins.                      Do not wear jewelry.            Do not wear lotions, powders, colognes, or deodorant.            Men may shave face and neck.            Do not bring valuables to the hospital.            [facility] is not responsible for any belongings or valuables.  Do NOT Smoke (Tobacco/Vapping) or drink Alcohol 24 hours prior to your procedure If you use a CPAP at night, you may bring all equipment for your overnight stay.   Contacts, glasses, dentures or bridgework may not be worn into surgery.      For patients admitted to the hospital, discharge time will be determined by your treatment team.   Patients discharged the day of surgery will not be allowed to drive home, and someone needs to stay with them for 24 hours.    Special instructions:   [place]- Preparing For Surgery  Before surgery, you can play  an important role. Because skin is not sterile, your skin needs to be as free of germs as possible. You can reduce the number of germs on your skin by washing with CHG (chlorahexidine gluconate) Soap before surgery.  CHG is an antiseptic cleaner which kills germs and bonds with the skin to continue killing germs even after washing.    Oral Hygiene is also important to reduce your risk of infection.  Remember - BRUSH YOUR TEETH THE MORNING OF SURGERY WITH YOUR REGULAR TOOTHPASTE  Please do not use if you have an allergy to CHG or antibacterial soaps. If your skin becomes reddened/irritated stop using the CHG.  Do not shave (including legs and underarms) for at least 48 hours prior to first CHG shower. It is OK to shave your face.  Please follow these instructions carefully.   1. Shower the NIGHT BEFORE SURGERY and the MORNING OF SURGERY with CHG Soap.   2. If you chose to wash your hair, wash your hair first as usual with your normal shampoo.  3. After you shampoo, rinse your hair and body thoroughly to remove the shampoo.  4. Use CHG as you would any other liquid  soap. You can apply CHG directly to the skin and wash gently with a scrungie or a clean washcloth.   5. Apply the CHG Soap to your body ONLY FROM THE NECK DOWN.  Do not use on open wounds or open sores. Avoid contact with your eyes, ears, mouth and genitals (private parts). Wash Face and genitals (private parts)  with your normal soap.   6. Wash thoroughly, paying special attention to the area where your surgery will be performed.  7. Thoroughly rinse your body with warm water from the neck down.  8. DO NOT shower/wash with your normal soap after using and rinsing off the CHG Soap.  9. Pat yourself dry with a CLEAN TOWEL.  10. Wear CLEAN PAJAMAS to bed the night before surgery, wear comfortable clothes the morning of surgery  11. Place CLEAN SHEETS on your bed the night of your first shower and DO NOT SLEEP WITH PETS.   Day  of Surgery:   Do not apply any deodorants/lotions.  Please wear clean clothes to the hospital/surgery center.   Remember to brush your teeth WITH YOUR REGULAR TOOTHPASTE.   Please read over the following fact sheets that you were given.

## 2019-05-17 ENCOUNTER — Encounter (HOSPITAL_COMMUNITY): Payer: Self-pay

## 2019-05-17 ENCOUNTER — Other Ambulatory Visit (HOSPITAL_COMMUNITY)
Admission: RE | Admit: 2019-05-17 | Discharge: 2019-05-17 | Disposition: A | Discharge status: Home / Self Care | Payer: Medicare Other | Source: Ambulatory Visit | Attending: Surgery | Admitting: Surgery

## 2019-05-17 ENCOUNTER — Other Ambulatory Visit: Payer: Self-pay

## 2019-05-17 ENCOUNTER — Encounter (HOSPITAL_COMMUNITY)
Admission: RE | Admit: 2019-05-17 | Discharge: 2019-05-17 | Disposition: A | Discharge status: Home / Self Care | Payer: Medicare Other | Source: Ambulatory Visit | Attending: Surgery | Admitting: Surgery

## 2019-05-17 HISTORY — DX: Gastro-esophageal reflux disease without esophagitis: K21.9

## 2019-05-17 LAB — URINALYSIS, ROUTINE W REFLEX MICROSCOPIC
Bacteria, UA: NONE SEEN
Bilirubin Urine: NEGATIVE
Glucose, UA: NEGATIVE mg/dL
Hgb urine dipstick: NEGATIVE
Ketones, ur: NEGATIVE mg/dL
Leukocytes,Ua: NEGATIVE
Nitrite: NEGATIVE
Protein, ur: >=300 mg/dL — AB
Specific Gravity, Urine: 1.013 (ref 1.005–1.030)
pH: 5 (ref 5.0–8.0)

## 2019-05-17 LAB — PROTIME-INR
INR: 1 (ref 0.8–1.2)
Prothrombin Time: 13 seconds (ref 11.4–15.2)

## 2019-05-17 LAB — APTT: aPTT: 39 seconds — ABNORMAL HIGH (ref 24–36)

## 2019-05-17 LAB — COMPREHENSIVE METABOLIC PANEL
ALT: 10 U/L (ref 0–44)
AST: 12 U/L — ABNORMAL LOW (ref 15–41)
Albumin: 3.8 g/dL (ref 3.5–5.0)
Alkaline Phosphatase: 66 U/L (ref 38–126)
Anion gap: 10 (ref 5–15)
BUN: 53 mg/dL — ABNORMAL HIGH (ref 8–23)
CO2: 17 mmol/L — ABNORMAL LOW (ref 22–32)
Calcium: 8.9 mg/dL (ref 8.9–10.3)
Chloride: 111 mmol/L (ref 98–111)
Creatinine, Ser: 3.53 mg/dL — ABNORMAL HIGH (ref 0.61–1.24)
GFR calc Af Amer: 17 mL/min — ABNORMAL LOW (ref >60)
GFR calc non Af Amer: 15 mL/min — ABNORMAL LOW (ref >60)
Glucose, Bld: 100 mg/dL — ABNORMAL HIGH (ref 70–99)
Potassium: 5.3 mmol/L — ABNORMAL HIGH (ref 3.5–5.1)
Sodium: 138 mmol/L (ref 135–145)
Total Bilirubin: 0.5 mg/dL (ref 0.3–1.2)
Total Protein: 7 g/dL (ref 6.5–8.1)

## 2019-05-17 LAB — CBC
HCT: 35.9 % — ABNORMAL LOW (ref 39.0–52.0)
Hemoglobin: 11.8 g/dL — ABNORMAL LOW (ref 13.0–17.0)
MCH: 31 pg (ref 26.0–34.0)
MCHC: 32.9 g/dL (ref 30.0–36.0)
MCV: 94.2 fL (ref 80.0–100.0)
Platelets: 181 10*3/uL (ref 150–400)
RBC: 3.81 MIL/uL — ABNORMAL LOW (ref 4.22–5.81)
RDW: 15.5 % (ref 11.5–15.5)
WBC: 8.1 10*3/uL (ref 4.0–10.5)
nRBC: 0 % (ref 0.0–0.2)

## 2019-05-17 LAB — SURGICAL PCR SCREEN
MRSA, PCR: NEGATIVE
Staphylococcus aureus: NEGATIVE

## 2019-05-17 LAB — SARS CORONAVIRUS 2 (TAT 6-24 HRS): SARS Coronavirus 2: NEGATIVE

## 2019-05-17 LAB — TYPE AND SCREEN
ABO/RH(D): O POS
Antibody Screen: NEGATIVE

## 2019-05-17 LAB — ABO/RH: ABO/RH(D): O POS

## 2019-05-17 NOTE — Progress Notes (Signed)
PCP - Dr. Nicholos Johns Cardiologist - Dr. Maude Leriche  PPM/ICD - Denies  Chest x-ray - N/A EKG - 05/15/19 Stress Test - Pt states he had one about 15 years ago. ECHO - 2017; Pt doesn't recall where it was done. [place] Cardiac Cath - Denies  Sleep Study - Denies  Pt denies being diabetic.   Blood Thinner Instructions: N/A Aspirin Instructions: Pt instructed to continue as prescribed.  ERAS Protcol - No  COVID TEST- 05/17/19   Coronavirus Screening  Have you experienced the following symptoms:  Cough yes/no: No Fever (>100.49F)  yes/no: No Runny nose yes/no: No Sore throat yes/no: No Difficulty breathing/shortness of breath  yes/no: No  Have you or a family member traveled in the last 14 days and where? yes/no: No   If the patient indicates "YES" to the above questions, their PAT will be rescheduled to limit the exposure to others and, the surgeon will be notified. THE PATIENT WILL NEED TO BE ASYMPTOMATIC FOR 14 DAYS.   If the patient is not experiencing any of these symptoms, the PAT nurse will instruct them to NOT bring anyone with them to their appointment since they may have these symptoms or traveled as well.   Please remind your patients and families that hospital visitation restrictions are in effect and the importance of the restrictions.     Anesthesia review: Yes, cardiac hx; abnormal EKG 05/15/19  Patient denies shortness of breath, fever, cough and chest pain at PAT appointment   All instructions explained to the patient, with a verbal understanding of the material. Patient agrees to go over the instructions while at home for a better understanding. Patient also instructed to self quarantine after being tested for COVID-19. The opportunity to ask questions was provided.

## 2019-05-18 NOTE — Anesthesia Preprocedure Evaluation (Addendum)
Anesthesia Evaluation  Patient identified by MRN, date of birth, ID band Patient awake    Reviewed: Allergy & Precautions, H&P , NPO status , Patient's Chart, lab work & pertinent test results, reviewed documented beta blocker date and time   Airway Mallampati: II  TM Distance: >3 FB Neck ROM: Full    Dental no notable dental hx. (+) Teeth Intact, Dental Advisory Given   Pulmonary COPD, Current Smoker and Patient abstained from smoking.,    Pulmonary exam normal breath sounds clear to auscultation       Cardiovascular Exercise Tolerance: Good hypertension, Pt. on medications and Pt. on home beta blockers + CAD   Rhythm:Regular Rate:Bradycardia     Neuro/Psych negative neurological ROS  negative psych ROS   GI/Hepatic Neg liver ROS, GERD  Medicated and Controlled,  Endo/Other  negative endocrine ROS  Renal/GU Renal InsufficiencyRenal disease  negative genitourinary   Musculoskeletal  (+) Arthritis , Osteoarthritis,    Abdominal   Peds  Hematology negative hematology ROS (+)   Anesthesia Other Findings   Reproductive/Obstetrics negative OB ROS                           Anesthesia Physical Anesthesia Plan  ASA: III  Anesthesia Plan: General   Post-op Pain Management:    Induction: Intravenous  PONV Risk Score and Plan: 2 and Ondansetron and Dexamethasone  Airway Management Planned: Oral ETT  Additional Equipment: Arterial line  Intra-op Plan:   Post-operative Plan: Possible Post-op intubation/ventilation and Extubation in OR  Informed Consent: I have reviewed the patients History and Physical, chart, labs and discussed the procedure including the risks, benefits and alternatives for the proposed anesthesia with the patient or authorized representative who has indicated his/her understanding and acceptance.     Dental advisory given  Plan Discussed with: CRNA  Anesthesia  Plan Comments: (Follows with cardiologist Dr. Geraldo Pitter for hx of CAD. Seen 05/15/19 for preop clearance. Per note, "Preoperative cardiovascular stratification: Secondary prevention stressed with the patient.  Importance of compliance with diet and medication stressed and he vocalized understanding.  I discussed my findings with the patient extensively and.  His surgery is coming up on Friday.  He has excellent effort tolerance and in view of this I do not think he is at high risk for coronary events during the aforementioned surgery.  He is moderate risk in view of his multiple comorbidities.  Meticulous hemodynamic monitoring and continued perioperative beta-blockade will further reduce the risk of coronary events.  Patient understands this.  EKG done today reveals sinus rhythm and nonspecific ST-T changes.  Sinus bradycardia with first-degree AV block and left anterior fascicular block.  As the patient has significant bradycardia I have cut down his beta-blocker dose and advised him accordingly."  Last echocardiogram was done in 2017 at [facility], resuts not visible in Epic. Per Dr. Julien Nordmann notes at that time "echocardiogram revealed normal left ventricular systolic function and was otherwise unremarkable." Results have been requested.   Hx of right nephrectomy and CKD IV followed by Dr. Eliberto Ivory kidney. He is on Veltassa for hx of hyperkalemia.  Recent creatinine baseline ~3.50. Dr. Trula Slade has discussed risks of worsening renal function with the pt.   Preop labs reviewed. Potassium mildly elevated 5.3 Creatinine 3.53 which is near his recent baseline. Hemoglobin 11.8.  EKG 05/15/19: Marked sinus bradycardia with sinus arrhythmia and 1st degree AV block. RBBB. LAFB. Lateral infarct, age undetermined.  CT abd/pelvis 03/09/19: IMPRESSION: 1. Enlarging infrarenal abdominal aortic aneurysm with a proximal fusiform configuration with superimposed saccular aneurysmal  segment just proximal to the bifurcation with a maximal diameter of up to 5.6 cm Recommend followup by abdomen and pelvis CTA in 3-6 months, and vascular surgery referral/consultation if not already obtained. Vascular surgery consultation recommended due to increased risk of rupture for AAA >5.5 cm. This recommendation follows ACR consensus guidelines: White Paper of the ACR Incidental Findings Committee II on Vascular Findings. J Am Coll Radiol 2013; 10:789-794. Aortic aneurysm NOS (ICD10-I71.9). 2. 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 Extension into the proximal left iliac artery measuring up to 3.2 cm. 3.  Aortic Atherosclerosis (ICD10-I70.0). 4. Prior right nephrectomy. No abnormal soft tissue attenuation in the nephrectomy bed. 5. Redemonstration of the multi cystic change of the left kidney with compensatory hypertrophy. Several new hyperdense cystic lesions likely reflect hemorrhagic cysts. Could consider further evaluation with renal ultrasound. 6. Question mild rugal fold thickening of the gastric antrum. This finding can be seen with gastritis. 7. Colonic diverticulosis without evidence of diverticulitis.  Carotid US 12/12/18: Summary:  Right Carotid: Velocities in the right ICA are consistent with a 1-39% stenosis.   Left Carotid: Velocities in the left ICA are consistent with a 1-39% stenosis.   Vertebrals: Bilateral vertebral arteries demonstrate antegrade flow.  Subclavians: Normal flow hemodynamics were seen in bilateral subclavian arteries.  )       Anesthesia Quick Evaluation

## 2019-05-18 NOTE — Progress Notes (Signed)
Anesthesia Chart Review:  Follows with cardiologist Dr. Geraldo Pitter for hx of CAD. Seen 05/15/19 for preop clearance. Per note, "Preoperative cardiovascular stratification: Secondary prevention stressed with the patient.  Importance of compliance with diet and medication stressed and he vocalized understanding.  I discussed my findings with the patient extensively and.  His surgery is coming up on Friday.  He has excellent effort tolerance and in view of this I do not think he is at high risk for coronary events during the aforementioned surgery.  He is moderate risk in view of his multiple comorbidities.  Meticulous hemodynamic monitoring and continued perioperative beta-blockade will further reduce the risk of coronary events.  Patient understands this.  EKG done today reveals sinus rhythm and nonspecific ST-T changes.  Sinus bradycardia with first-degree AV block and left anterior fascicular block.  As the patient has significant bradycardia I have cut down his beta-blocker dose and advised him accordingly."  Last echocardiogram was done in 2017 at [facility], resuts not visible in Epic. Per Dr. Julien Nordmann notes at that time "echocardiogram revealed normal left ventricular systolic function and was otherwise unremarkable." Results have been requested.   Hx of right nephrectomy and CKD IV followed by Dr. Eliberto Ivory kidney. He is on Veltassa for hx of hyperkalemia.  Recent creatinine baseline ~3.50. Dr. Trula Slade has discussed risks of worsening renal function with the pt.   Preop labs reviewed. Potassium mildly elevated 5.3 Creatinine 3.53 which is near his recent baseline. Hemoglobin 11.8.  EKG 05/15/19: Marked sinus bradycardia with sinus arrhythmia and 1st degree AV block. RBBB. LAFB. Lateral infarct, age undetermined.   CT abd/pelvis 03/09/19: IMPRESSION: 1. Enlarging infrarenal abdominal aortic aneurysm with a proximal fusiform configuration with superimposed saccular  aneurysmal segment just proximal to the bifurcation with a maximal diameter of up to 5.6 cm Recommend followup by abdomen and pelvis CTA in 3-6 months, and vascular surgery referral/consultation if not already obtained. Vascular surgery consultation recommended due to increased risk of rupture for AAA >5.5 cm. This recommendation follows ACR consensus guidelines: White Paper of the ACR Incidental Findings Committee II on Vascular Findings. J Am Coll Radiol 2013; 10:789-794. Aortic aneurysm NOS (ICD10-I71.9). 2. 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 Extension into the proximal left iliac artery measuring up to 3.2 cm. 3.  Aortic Atherosclerosis (ICD10-I70.0). 4. Prior right nephrectomy. No abnormal soft tissue attenuation in the nephrectomy bed. 5. Redemonstration of the multi cystic change of the left kidney with compensatory hypertrophy. Several new hyperdense cystic lesions likely reflect hemorrhagic cysts. Could consider further evaluation with renal ultrasound. 6. Question mild rugal fold thickening of the gastric antrum. This finding can be seen with gastritis. 7. Colonic diverticulosis without evidence of diverticulitis.  Carotid US 12/12/18: Summary:  Right Carotid: Velocities in the right ICA are consistent with a 1-39% stenosis.   Left Carotid: Velocities in the left ICA are consistent with a 1-39% stenosis.   Vertebrals: Bilateral vertebral arteries demonstrate antegrade flow.  Subclavians: Normal flow hemodynamics were seen in bilateral subclavian arteries.    Dustin Vaughan [facility] Short Stay Center/Anesthesiology Phone [phone] 05/18/2019 10:17 AM

## 2019-05-19 ENCOUNTER — Inpatient Hospital Stay (HOSPITAL_COMMUNITY): Payer: Medicare Other

## 2019-05-19 ENCOUNTER — Inpatient Hospital Stay (HOSPITAL_COMMUNITY): Payer: Medicare Other | Admitting: Physician Assistant

## 2019-05-19 ENCOUNTER — Encounter (HOSPITAL_COMMUNITY)
Admission: RE | Disposition: A | Discharge status: Home / Self Care | Payer: Self-pay | Source: Home / Self Care | Attending: Surgery

## 2019-05-19 ENCOUNTER — Other Ambulatory Visit: Payer: Self-pay

## 2019-05-19 ENCOUNTER — Encounter (HOSPITAL_COMMUNITY): Payer: Self-pay | Admitting: Surgery

## 2019-05-19 ENCOUNTER — Inpatient Hospital Stay (HOSPITAL_COMMUNITY)
Admission: RE | Admit: 2019-05-19 | Discharge: 2019-05-21 | DRG: 269 | Disposition: A | Discharge status: Home / Self Care | Payer: Medicare Other | Attending: Surgery | Admitting: Surgery

## 2019-05-19 ENCOUNTER — Inpatient Hospital Stay (HOSPITAL_COMMUNITY): Payer: Medicare Other | Admitting: Certified Registered"

## 2019-05-19 DIAGNOSIS — Z20822 Contact with and (suspected) exposure to covid-19: Secondary | ICD-10-CM | POA: Diagnosis present

## 2019-05-19 DIAGNOSIS — Z905 Acquired absence of kidney: Secondary | ICD-10-CM

## 2019-05-19 DIAGNOSIS — I129 Hypertensive chronic kidney disease with stage 1 through stage 4 chronic kidney disease, or unspecified chronic kidney disease: Secondary | ICD-10-CM | POA: Diagnosis present

## 2019-05-19 DIAGNOSIS — I440 Atrioventricular block, first degree: Secondary | ICD-10-CM | POA: Diagnosis present

## 2019-05-19 DIAGNOSIS — I700 Atherosclerosis of aorta: Secondary | ICD-10-CM | POA: Diagnosis present

## 2019-05-19 DIAGNOSIS — I452 Bifascicular block: Secondary | ICD-10-CM | POA: Diagnosis present

## 2019-05-19 DIAGNOSIS — I714 Abdominal aortic aneurysm, without rupture, unspecified: Secondary | ICD-10-CM

## 2019-05-19 DIAGNOSIS — E559 Vitamin D deficiency, unspecified: Secondary | ICD-10-CM | POA: Diagnosis present

## 2019-05-19 DIAGNOSIS — Z8679 Personal history of other diseases of the circulatory system: Secondary | ICD-10-CM

## 2019-05-19 DIAGNOSIS — J449 Chronic obstructive pulmonary disease, unspecified: Secondary | ICD-10-CM | POA: Diagnosis present

## 2019-05-19 DIAGNOSIS — I2510 Atherosclerotic heart disease of native coronary artery without angina pectoris: Secondary | ICD-10-CM | POA: Diagnosis present

## 2019-05-19 DIAGNOSIS — I724 Aneurysm of artery of lower extremity: Secondary | ICD-10-CM | POA: Diagnosis present

## 2019-05-19 DIAGNOSIS — D62 Acute posthemorrhagic anemia: Secondary | ICD-10-CM | POA: Diagnosis not present

## 2019-05-19 DIAGNOSIS — Z85528 Personal history of other malignant neoplasm of kidney: Secondary | ICD-10-CM

## 2019-05-19 DIAGNOSIS — F17210 Nicotine dependence, cigarettes, uncomplicated: Secondary | ICD-10-CM | POA: Diagnosis present

## 2019-05-19 DIAGNOSIS — Z923 Personal history of irradiation: Secondary | ICD-10-CM

## 2019-05-19 DIAGNOSIS — Z79890 Hormone replacement therapy (postmenopausal): Secondary | ICD-10-CM

## 2019-05-19 DIAGNOSIS — K219 Gastro-esophageal reflux disease without esophagitis: Secondary | ICD-10-CM | POA: Diagnosis present

## 2019-05-19 DIAGNOSIS — Z83438 Family history of other disorder of lipoprotein metabolism and other lipidemia: Secondary | ICD-10-CM

## 2019-05-19 DIAGNOSIS — N184 Chronic kidney disease, stage 4 (severe): Secondary | ICD-10-CM | POA: Diagnosis present

## 2019-05-19 DIAGNOSIS — Z79899 Other long term (current) drug therapy: Secondary | ICD-10-CM

## 2019-05-19 DIAGNOSIS — E875 Hyperkalemia: Secondary | ICD-10-CM | POA: Diagnosis present

## 2019-05-19 DIAGNOSIS — Z8249 Family history of ischemic heart disease and other diseases of the circulatory system: Secondary | ICD-10-CM

## 2019-05-19 DIAGNOSIS — N189 Chronic kidney disease, unspecified: Secondary | ICD-10-CM | POA: Diagnosis not present

## 2019-05-19 DIAGNOSIS — R319 Hematuria, unspecified: Secondary | ICD-10-CM | POA: Diagnosis not present

## 2019-05-19 DIAGNOSIS — Z7901 Long term (current) use of anticoagulants: Secondary | ICD-10-CM

## 2019-05-19 DIAGNOSIS — E7800 Pure hypercholesterolemia, unspecified: Secondary | ICD-10-CM | POA: Diagnosis present

## 2019-05-19 DIAGNOSIS — Z86010 Personal history of colonic polyps: Secondary | ICD-10-CM | POA: Diagnosis not present

## 2019-05-19 DIAGNOSIS — Z7982 Long term (current) use of aspirin: Secondary | ICD-10-CM

## 2019-05-19 DIAGNOSIS — E785 Hyperlipidemia, unspecified: Secondary | ICD-10-CM | POA: Diagnosis present

## 2019-05-19 DIAGNOSIS — Z8546 Personal history of malignant neoplasm of prostate: Secondary | ICD-10-CM

## 2019-05-19 DIAGNOSIS — H269 Unspecified cataract: Secondary | ICD-10-CM | POA: Diagnosis present

## 2019-05-19 DIAGNOSIS — I130 Hypertensive heart and chronic kidney disease with heart failure and stage 1 through stage 4 chronic kidney disease, or unspecified chronic kidney disease: Secondary | ICD-10-CM | POA: Diagnosis not present

## 2019-05-19 DIAGNOSIS — Z823 Family history of stroke: Secondary | ICD-10-CM

## 2019-05-19 DIAGNOSIS — K5730 Diverticulosis of large intestine without perforation or abscess without bleeding: Secondary | ICD-10-CM | POA: Diagnosis present

## 2019-05-19 DIAGNOSIS — Z95828 Presence of other vascular implants and grafts: Secondary | ICD-10-CM

## 2019-05-19 DIAGNOSIS — Z833 Family history of diabetes mellitus: Secondary | ICD-10-CM

## 2019-05-19 HISTORY — PX: ABDOMINAL AORTIC ENDOVASCULAR STENT GRAFT: SHX5707

## 2019-05-19 HISTORY — PX: ULTRASOUND GUIDANCE FOR VASCULAR ACCESS: SHX6516

## 2019-05-19 HISTORY — DX: Abdominal aortic aneurysm, without rupture: I71.4

## 2019-05-19 HISTORY — DX: Abdominal aortic aneurysm, without rupture, unspecified: I71.40

## 2019-05-19 LAB — CBC
HCT: 33.2 % — ABNORMAL LOW (ref 39.0–52.0)
Hemoglobin: 10.9 g/dL — ABNORMAL LOW (ref 13.0–17.0)
MCH: 31.3 pg (ref 26.0–34.0)
MCHC: 32.8 g/dL (ref 30.0–36.0)
MCV: 95.4 fL (ref 80.0–100.0)
Platelets: 139 10*3/uL — ABNORMAL LOW (ref 150–400)
RBC: 3.48 MIL/uL — ABNORMAL LOW (ref 4.22–5.81)
RDW: 15.6 % — ABNORMAL HIGH (ref 11.5–15.5)
WBC: 9.3 10*3/uL (ref 4.0–10.5)
nRBC: 0 % (ref 0.0–0.2)

## 2019-05-19 LAB — BASIC METABOLIC PANEL
Anion gap: 6 (ref 5–15)
BUN: 46 mg/dL — ABNORMAL HIGH (ref 8–23)
CO2: 16 mmol/L — ABNORMAL LOW (ref 22–32)
Calcium: 8.6 mg/dL — ABNORMAL LOW (ref 8.9–10.3)
Chloride: 117 mmol/L — ABNORMAL HIGH (ref 98–111)
Creatinine, Ser: 3.16 mg/dL — ABNORMAL HIGH (ref 0.61–1.24)
GFR calc Af Amer: 20 mL/min — ABNORMAL LOW (ref >60)
GFR calc non Af Amer: 17 mL/min — ABNORMAL LOW (ref >60)
Glucose, Bld: 138 mg/dL — ABNORMAL HIGH (ref 70–99)
Potassium: 5.1 mmol/L (ref 3.5–5.1)
Sodium: 139 mmol/L (ref 135–145)

## 2019-05-19 LAB — POCT ACTIVATED CLOTTING TIME
Activated Clotting Time: 186 seconds
Activated Clotting Time: 230 seconds
Activated Clotting Time: 230 seconds
Activated Clotting Time: 219 seconds

## 2019-05-19 LAB — APTT: aPTT: 41 seconds — ABNORMAL HIGH (ref 24–36)

## 2019-05-19 LAB — MAGNESIUM: Magnesium: 1.6 mg/dL — ABNORMAL LOW (ref 1.7–2.4)

## 2019-05-19 LAB — PROTIME-INR
INR: 1.2 (ref 0.8–1.2)
Prothrombin Time: 15.4 seconds — ABNORMAL HIGH (ref 11.4–15.2)

## 2019-05-19 SURGERY — INSERTION, ENDOVASCULAR STENT GRAFT, AORTA, ABDOMINAL
Anesthesia: General

## 2019-05-19 MED ORDER — EPHEDRINE 5 MG/ML INJ
INTRAVENOUS | Status: AC
  Filled 2019-05-19: qty 10

## 2019-05-19 MED ORDER — SODIUM CHLORIDE 0.9 % IV SOLN: 500.0000 mL | Freq: Once | INTRAVENOUS | Status: DC | PRN

## 2019-05-19 MED ORDER — ROCURONIUM BROMIDE 10 MG/ML (PF) SYRINGE
PREFILLED_SYRINGE | INTRAVENOUS | Status: DC | PRN
  Administered 2019-05-19 (×2): 10 mg via INTRAVENOUS
  Administered 2019-05-19: 70 mg via INTRAVENOUS

## 2019-05-19 MED ORDER — FENTANYL CITRATE (PF) 100 MCG/2ML IJ SOLN
25.0000 ug | INTRAMUSCULAR | Status: DC | PRN
  Administered 2019-05-19 (×2): 50 ug via INTRAVENOUS

## 2019-05-19 MED ORDER — PROPOFOL 10 MG/ML IV BOLUS
INTRAVENOUS | Status: AC
  Filled 2019-05-19: qty 20

## 2019-05-19 MED ORDER — GLYCOPYRROLATE PF 0.2 MG/ML IJ SOSY
PREFILLED_SYRINGE | INTRAMUSCULAR | Status: AC
  Filled 2019-05-19: qty 1

## 2019-05-19 MED ORDER — ONDANSETRON HCL 4 MG/2ML IJ SOLN
INTRAMUSCULAR | Status: AC
  Filled 2019-05-19: qty 2

## 2019-05-19 MED ORDER — SIMVASTATIN 20 MG PO TABS
10.0000 mg | ORAL_TABLET | Freq: Every day | ORAL | Status: DC
  Administered 2019-05-19 – 2019-05-20 (×2): 10 mg via ORAL
  Filled 2019-05-19 (×2): qty 1

## 2019-05-19 MED ORDER — ASPIRIN EC 81 MG PO TBEC
81.0000 mg | DELAYED_RELEASE_TABLET | Freq: Every day | ORAL | Status: DC
  Administered 2019-05-20 – 2019-05-21 (×2): 81 mg via ORAL
  Filled 2019-05-19 (×2): qty 1

## 2019-05-19 MED ORDER — ONDANSETRON HCL 4 MG/2ML IJ SOLN: 4.0000 mg | Freq: Four times a day (QID) | INTRAMUSCULAR | Status: DC | PRN

## 2019-05-19 MED ORDER — LIDOCAINE 2% (20 MG/ML) 5 ML SYRINGE
INTRAMUSCULAR | Status: AC
  Filled 2019-05-19: qty 5

## 2019-05-19 MED ORDER — HEPARIN SODIUM (PORCINE) 1000 UNIT/ML IJ SOLN
INTRAMUSCULAR | Status: DC | PRN
  Administered 2019-05-19: 7000 [IU] via INTRAVENOUS
  Administered 2019-05-19: 3000 [IU] via INTRAVENOUS

## 2019-05-19 MED ORDER — IODIXANOL 320 MG/ML IV SOLN
INTRAVENOUS | Status: DC | PRN
  Administered 2019-05-19: 23.5 mL via INTRA_ARTERIAL

## 2019-05-19 MED ORDER — BISACODYL 10 MG RE SUPP: 10.0000 mg | Freq: Every day | RECTAL | Status: DC | PRN

## 2019-05-19 MED ORDER — SUGAMMADEX SODIUM 200 MG/2ML IV SOLN
INTRAVENOUS | Status: DC | PRN
  Administered 2019-05-19 (×2): 100 mg via INTRAVENOUS

## 2019-05-19 MED ORDER — PROTAMINE SULFATE 10 MG/ML IV SOLN
INTRAVENOUS | Status: DC | PRN
  Administered 2019-05-19: 50 mg via INTRAVENOUS

## 2019-05-19 MED ORDER — SODIUM CHLORIDE 0.9 % IV SOLN: INTRAVENOUS | Status: DC | PRN

## 2019-05-19 MED ORDER — HYDRALAZINE HCL 20 MG/ML IJ SOLN: 5.0000 mg | INTRAMUSCULAR | Status: DC | PRN

## 2019-05-19 MED ORDER — LIDOCAINE 2% (20 MG/ML) 5 ML SYRINGE
INTRAMUSCULAR | Status: DC | PRN
  Administered 2019-05-19: 60 mg via INTRAVENOUS

## 2019-05-19 MED ORDER — LACTATED RINGERS IV SOLN: INTRAVENOUS | Status: DC | PRN

## 2019-05-19 MED ORDER — CHLORHEXIDINE GLUCONATE CLOTH 2 % EX PADS: 6.0000 | MEDICATED_PAD | Freq: Once | CUTANEOUS | Status: DC

## 2019-05-19 MED ORDER — GUAIFENESIN-DM 100-10 MG/5ML PO SYRP: 15.0000 mL | ORAL_SOLUTION | ORAL | Status: DC | PRN

## 2019-05-19 MED ORDER — CHLORHEXIDINE GLUCONATE CLOTH 2 % EX PADS: 6.0000 | MEDICATED_PAD | Freq: Every day | CUTANEOUS | Status: DC

## 2019-05-19 MED ORDER — FENTANYL CITRATE (PF) 100 MCG/2ML IJ SOLN
INTRAMUSCULAR | Status: AC
  Filled 2019-05-19: qty 2

## 2019-05-19 MED ORDER — CEFAZOLIN SODIUM-DEXTROSE 2-4 GM/100ML-% IV SOLN
2.0000 g | INTRAVENOUS | Status: AC
  Administered 2019-05-19: 2 g via INTRAVENOUS
  Filled 2019-05-19: qty 100

## 2019-05-19 MED ORDER — PHENOL 1.4 % MT LIQD: 1.0000 | OROMUCOSAL | Status: DC | PRN

## 2019-05-19 MED ORDER — ONDANSETRON HCL 4 MG/2ML IJ SOLN
INTRAMUSCULAR | Status: DC | PRN
  Administered 2019-05-19: 4 mg via INTRAVENOUS

## 2019-05-19 MED ORDER — SODIUM CHLORIDE 0.9 % IV SOLN: INTRAVENOUS | Status: DC

## 2019-05-19 MED ORDER — HEPARIN SODIUM (PORCINE) 5000 UNIT/ML IJ SOLN
5000.0000 [IU] | Freq: Three times a day (TID) | INTRAMUSCULAR | Status: DC
  Administered 2019-05-20 – 2019-05-21 (×3): 5000 [IU] via SUBCUTANEOUS
  Filled 2019-05-19 (×4): qty 1

## 2019-05-19 MED ORDER — DEXAMETHASONE SODIUM PHOSPHATE 10 MG/ML IJ SOLN
INTRAMUSCULAR | Status: DC | PRN
  Administered 2019-05-19: 5 mg via INTRAVENOUS

## 2019-05-19 MED ORDER — HEPARIN SODIUM (PORCINE) 1000 UNIT/ML IJ SOLN
INTRAMUSCULAR | Status: AC
  Filled 2019-05-19: qty 1

## 2019-05-19 MED ORDER — HYDROMORPHONE HCL 1 MG/ML IJ SOLN
0.5000 mg | INTRAMUSCULAR | Status: DC | PRN
  Administered 2019-05-19: 0.5 mg via INTRAVENOUS

## 2019-05-19 MED ORDER — PROPOFOL 10 MG/ML IV BOLUS
INTRAVENOUS | Status: DC | PRN
  Administered 2019-05-19: 110 mg via INTRAVENOUS

## 2019-05-19 MED ORDER — OXYCODONE-ACETAMINOPHEN 5-325 MG PO TABS
ORAL_TABLET | ORAL | Status: AC
  Filled 2019-05-19: qty 1

## 2019-05-19 MED ORDER — METOPROLOL TARTRATE 50 MG PO TABS
50.0000 mg | ORAL_TABLET | Freq: Two times a day (BID) | ORAL | Status: DC
  Administered 2019-05-19 – 2019-05-21 (×4): 50 mg via ORAL
  Filled 2019-05-19 (×4): qty 1

## 2019-05-19 MED ORDER — ROCURONIUM BROMIDE 10 MG/ML (PF) SYRINGE
PREFILLED_SYRINGE | INTRAVENOUS | Status: AC
  Filled 2019-05-19: qty 10

## 2019-05-19 MED ORDER — LEVOTHYROXINE SODIUM 75 MCG PO TABS
37.5000 ug | ORAL_TABLET | Freq: Every day | ORAL | Status: DC
  Administered 2019-05-19 – 2019-05-20 (×2): 37.5 ug via ORAL
  Filled 2019-05-19 (×2): qty 1

## 2019-05-19 MED ORDER — MAGNESIUM SULFATE 2 GM/50ML IV SOLN: 2.0000 g | Freq: Every day | INTRAVENOUS | Status: DC | PRN

## 2019-05-19 MED ORDER — FERROUS SULFATE 325 (65 FE) MG PO TABS
325.0000 mg | ORAL_TABLET | Freq: Every day | ORAL | Status: DC
  Administered 2019-05-19 – 2019-05-21 (×3): 325 mg via ORAL
  Filled 2019-05-19 (×3): qty 1

## 2019-05-19 MED ORDER — ACETAMINOPHEN 325 MG RE SUPP: 325.0000 mg | RECTAL | Status: DC | PRN

## 2019-05-19 MED ORDER — FENTANYL CITRATE (PF) 250 MCG/5ML IJ SOLN
INTRAMUSCULAR | Status: DC | PRN
  Administered 2019-05-19: 50 ug via INTRAVENOUS
  Administered 2019-05-19: 100 ug via INTRAVENOUS

## 2019-05-19 MED ORDER — CEFAZOLIN SODIUM-DEXTROSE 1-4 GM/50ML-% IV SOLN
1.0000 g | Freq: Two times a day (BID) | INTRAVENOUS | Status: AC
  Administered 2019-05-19 – 2019-05-20 (×2): 1 g via INTRAVENOUS
  Filled 2019-05-19 (×2): qty 50

## 2019-05-19 MED ORDER — FLUTICASONE PROPIONATE 50 MCG/ACT NA SUSP
1.0000 | Freq: Every day | NASAL | Status: DC | PRN
  Filled 2019-05-19: qty 16

## 2019-05-19 MED ORDER — EPHEDRINE SULFATE-NACL 50-0.9 MG/10ML-% IV SOSY
PREFILLED_SYRINGE | INTRAVENOUS | Status: DC | PRN
  Administered 2019-05-19 (×3): 5 mg via INTRAVENOUS
  Administered 2019-05-19: 10 mg via INTRAVENOUS
  Administered 2019-05-19 (×4): 5 mg via INTRAVENOUS
  Administered 2019-05-19: 10 mg via INTRAVENOUS

## 2019-05-19 MED ORDER — GLYCOPYRROLATE 0.2 MG/ML IJ SOLN
INTRAMUSCULAR | Status: DC | PRN
  Administered 2019-05-19: .1 mg via INTRAVENOUS
  Administered 2019-05-19: .2 mg via INTRAVENOUS

## 2019-05-19 MED ORDER — HYDROMORPHONE HCL 1 MG/ML IJ SOLN
INTRAMUSCULAR | Status: AC
  Filled 2019-05-19: qty 1

## 2019-05-19 MED ORDER — 0.9 % SODIUM CHLORIDE (POUR BTL) OPTIME
TOPICAL | Status: DC | PRN
  Administered 2019-05-19: 1000 mL

## 2019-05-19 MED ORDER — OXYCODONE-ACETAMINOPHEN 5-325 MG PO TABS
1.0000 | ORAL_TABLET | Freq: Four times a day (QID) | ORAL | Status: DC | PRN
  Administered 2019-05-19: 13:00:00 2 via ORAL

## 2019-05-19 MED ORDER — LABETALOL HCL 5 MG/ML IV SOLN: 10.0000 mg | INTRAVENOUS | Status: DC | PRN

## 2019-05-19 MED ORDER — POTASSIUM CHLORIDE CRYS ER 20 MEQ PO TBCR: 20.0000 meq | EXTENDED_RELEASE_TABLET | Freq: Every day | ORAL | Status: DC | PRN

## 2019-05-19 MED ORDER — PHENYLEPHRINE HCL-NACL 10-0.9 MG/250ML-% IV SOLN
INTRAVENOUS | Status: DC | PRN
  Administered 2019-05-19: 30 ug/min via INTRAVENOUS

## 2019-05-19 MED ORDER — DOCUSATE SODIUM 100 MG PO CAPS
100.0000 mg | ORAL_CAPSULE | Freq: Every day | ORAL | Status: DC
  Filled 2019-05-19 (×2): qty 1

## 2019-05-19 MED ORDER — METOPROLOL TARTRATE 5 MG/5ML IV SOLN: 2.0000 mg | INTRAVENOUS | Status: DC | PRN

## 2019-05-19 MED ORDER — DEXAMETHASONE SODIUM PHOSPHATE 10 MG/ML IJ SOLN
INTRAMUSCULAR | Status: AC
  Filled 2019-05-19: qty 1

## 2019-05-19 MED ORDER — POLYETHYLENE GLYCOL 3350 17 G PO PACK: 17.0000 g | PACK | Freq: Every day | ORAL | Status: DC | PRN

## 2019-05-19 MED ORDER — PANTOPRAZOLE SODIUM 40 MG PO TBEC
40.0000 mg | DELAYED_RELEASE_TABLET | Freq: Every day | ORAL | Status: DC
  Administered 2019-05-19 – 2019-05-21 (×3): 40 mg via ORAL
  Filled 2019-05-19 (×3): qty 1

## 2019-05-19 MED ORDER — FENTANYL CITRATE (PF) 250 MCG/5ML IJ SOLN
INTRAMUSCULAR | Status: AC
  Filled 2019-05-19: qty 5

## 2019-05-19 MED ORDER — ACETAMINOPHEN 500 MG PO TABS
1000.0000 mg | ORAL_TABLET | Freq: Once | ORAL | Status: AC
  Administered 2019-05-19: 1000 mg via ORAL
  Filled 2019-05-19: qty 2

## 2019-05-19 MED ORDER — ACETAMINOPHEN 325 MG PO TABS: 325.0000 mg | ORAL_TABLET | ORAL | Status: DC | PRN

## 2019-05-19 MED ORDER — AMLODIPINE BESYLATE 10 MG PO TABS
10.0000 mg | ORAL_TABLET | Freq: Every day | ORAL | Status: DC
  Administered 2019-05-19 – 2019-05-21 (×3): 10 mg via ORAL
  Filled 2019-05-19 (×3): qty 1

## 2019-05-19 MED ORDER — SODIUM CHLORIDE 0.9 % IV SOLN
INTRAVENOUS | Status: AC
  Filled 2019-05-19: qty 1.2

## 2019-05-19 SURGICAL SUPPLY — 84 items
BALLN ATLAS 14X40X75 (BALLOONS) ×4
BALLOON ATLAS 14X40X75 (BALLOONS) IMPLANT
BLADE CLIPPER SURG (BLADE) ×4 IMPLANT
CANISTER SUCT 3000ML PPV (MISCELLANEOUS) ×4 IMPLANT
CATH BEACON 5.038 65CM KMP-01 (CATHETERS) ×4 IMPLANT
CATH OMNI FLUSH .035X70CM (CATHETERS) ×4 IMPLANT
CATH QUICKCROSS SUPP .035X90CM (MICROCATHETER) ×2 IMPLANT
CATH SOFTOUCH MOTARJEME 5F (CATHETERS) ×2 IMPLANT
COVER WAND RF STERILE (DRAPES) ×2 IMPLANT
DERMABOND ADVANCED (GAUZE/BANDAGES/DRESSINGS) ×2
DERMABOND ADVANCED .7 DNX12 (GAUZE/BANDAGES/DRESSINGS) ×2 IMPLANT
DEVICE CLOSURE PERCLS PRGLD 6F (VASCULAR PRODUCTS) IMPLANT
DEVICE ENSNARE  12MMX20MM (VASCULAR PRODUCTS) ×8
DEVICE ENSNARE 12MMX20MM (VASCULAR PRODUCTS) IMPLANT
DEVICE TORQUE H2O (MISCELLANEOUS) ×2 IMPLANT
DRAPE PERI GROIN 82X75IN TIB (DRAPES) ×2 IMPLANT
DRAPE TABLE BACK 80X90 (DRAPES) ×2 IMPLANT
DRAPE ZERO GRAVITY STERILE (DRAPES) ×2 IMPLANT
DRSG TEGADERM 2-3/8X2-3/4 SM (GAUZE/BANDAGES/DRESSINGS) ×8 IMPLANT
DRYSEAL FLEXSHEATH 12FR 45CM (SHEATH) ×2
DRYSEAL FLEXSHEATH 16FR 33CM (SHEATH) ×2
DRYSEAL FLEXSHEATH 18FR 33CM (SHEATH) ×2
ELECT CAUTERY BLADE 6.4 (BLADE) ×2 IMPLANT
ELECT REM PT RETURN 9FT ADLT (ELECTROSURGICAL) ×4
ELECTRODE REM PT RTRN 9FT ADLT (ELECTROSURGICAL) ×4 IMPLANT
ENDOPRO ILIAC 23X10X10 FA (Endovascular Graft) ×4 IMPLANT
ENDOPROSTHESIS ILI 23X10X10 FA (Endovascular Graft) IMPLANT
EXCLUDER TNK LEG 31MX14X13 (Endovascular Graft) IMPLANT
EXCLUDER TRUNK LEG 31MX14X13 (Endovascular Graft) ×4 IMPLANT
FILTER CO2 0.2 MICRON (VASCULAR PRODUCTS) ×2 IMPLANT
GAUZE SPONGE 2X2 8PLY STRL LF (GAUZE/BANDAGES/DRESSINGS) ×4 IMPLANT
GLIDEWIRE ADV .035X260CM (WIRE) ×2 IMPLANT
GLOVE BIOGEL PI IND STRL 7.5 (GLOVE) ×2 IMPLANT
GLOVE BIOGEL PI INDICATOR 7.5 (GLOVE) ×2
GLOVE SURG SS PI 7.5 STRL IVOR (GLOVE) ×4 IMPLANT
GOWN STRL REUS W/ TWL LRG LVL3 (GOWN DISPOSABLE) ×4 IMPLANT
GOWN STRL REUS W/ TWL XL LVL3 (GOWN DISPOSABLE) ×4 IMPLANT
GOWN STRL REUS W/TWL LRG LVL3 (GOWN DISPOSABLE) ×12
GOWN STRL REUS W/TWL XL LVL3 (GOWN DISPOSABLE) ×4
GRAFT BALLN CATH 65CM (STENTS) ×2 IMPLANT
GUIDEWIRE ANGLED .035X150CM (WIRE) ×2 IMPLANT
GUIDEWIRE ANGLED .035X260CM (WIRE) ×2 IMPLANT
KIT BASIN OR (CUSTOM PROCEDURE TRAY) ×4 IMPLANT
KIT ENCORE 26 ADVANTAGE (KITS) ×2 IMPLANT
KIT TURNOVER KIT B (KITS) ×4 IMPLANT
LEG CONTRALATERAL 16X16X9.5 (Endovascular Graft) ×2 IMPLANT
LEG CONTRALATERAL 27X10 (Vascular Products) ×2 IMPLANT
NS IRRIG 1000ML POUR BTL (IV SOLUTION) ×4 IMPLANT
PACK ENDOVASCULAR (PACKS) ×4 IMPLANT
PAD ARMBOARD 7.5X6 YLW CONV (MISCELLANEOUS) ×4 IMPLANT
PENCIL BUTTON HOLSTER BLD 10FT (ELECTRODE) ×4 IMPLANT
PERCLOSE PROGLIDE 6F (VASCULAR PRODUCTS) ×16
RESERVOIR CO2 (VASCULAR PRODUCTS) ×2 IMPLANT
SET FLUSH CO2 (MISCELLANEOUS) ×2 IMPLANT
SET MICROPUNCTURE 5F STIFF (MISCELLANEOUS) ×4 IMPLANT
SHEATH BRITE TIP 8FR 23CM (SHEATH) ×4 IMPLANT
SHEATH DRYSEAL FLEX 12FR 45CM (SHEATH) IMPLANT
SHEATH DRYSEAL FLEX 16FR 33CM (SHEATH) IMPLANT
SHEATH DRYSEAL FLEX 18FR 33CM (SHEATH) IMPLANT
SHEATH FLEXOR INTRODUCER 8FR (SHEATH) ×2 IMPLANT
SHEATH GUIDING 7F 55X73X9MM TD (SHEATH) ×2 IMPLANT
SHEATH PINNACLE 8F 10CM (SHEATH) ×4 IMPLANT
SHIELD RADPAD (MISCELLANEOUS) ×2 IMPLANT
SPONGE GAUZE 2X2 STER 10/PKG (GAUZE/BANDAGES/DRESSINGS) ×2
STENT GRAFT BALLN CATH 65CM (STENTS) ×4
STENT VIABAHN VBX 11X59X80 (Permanent Stent) ×2 IMPLANT
STOPCOCK MORSE 400PSI 3WAY (MISCELLANEOUS) ×4 IMPLANT
SUT PROLENE 5 0 C 1 24 (SUTURE) IMPLANT
SUT VIC AB 2-0 CT1 27 (SUTURE)
SUT VIC AB 2-0 CT1 TAPERPNT 27 (SUTURE) IMPLANT
SUT VIC AB 3-0 SH 27 (SUTURE)
SUT VIC AB 3-0 SH 27X BRD (SUTURE) IMPLANT
SUT VICRYL 4-0 PS2 18IN ABS (SUTURE) IMPLANT
SYR 10ML LL (SYRINGE) ×2 IMPLANT
SYR 20ML LL LF (SYRINGE) ×2 IMPLANT
SYR 50ML LL SCALE MARK (SYRINGE) ×2 IMPLANT
TOWEL GREEN STERILE (TOWEL DISPOSABLE) ×4 IMPLANT
TRAY FOLEY MTR SLVR 16FR STAT (SET/KITS/TRAYS/PACK) ×4 IMPLANT
TUBING HIGH PRESSURE 120CM (CONNECTOR) ×4 IMPLANT
WIRE AMPLATZ SS-J .035X180CM (WIRE) ×8 IMPLANT
WIRE AMPLATZ SS-J .035X260CM (WIRE) ×2 IMPLANT
WIRE BENTSON .035X145CM (WIRE) ×8 IMPLANT
WIRE HI TORQ VERSACORE J 260CM (WIRE) ×2 IMPLANT
WIRE ROSEN-J .035X260CM (WIRE) ×2 IMPLANT

## 2019-05-19 NOTE — Progress Notes (Signed)
  Day of Surgery Note    Subjective:  Seen in recovery-has some soreness in his groins   Vitals:   05/19/19 1210 05/19/19 1215  BP: 140/67   Pulse: (!) 51 (!) 48  Resp: 14 14  Temp:    SpO2: 96% 95%    Incisions:   Bilateral groins soft without hematoma Extremities:  Brisk PT doppler signals bilaterally Cardiac:  regular Lungs:  Non labored    Assessment/Plan:  This is a 86 y.o. male who is s/p  EVAR  -pt doing well in recover with brisk PT doppler signals -pt with starting creatinine of 3.5 and post op is 3.16.  Will start IVF at 50cc/hr in pacu for hydration as ~ 23.5cc of contrast was used.   -to 4 east later today - check labs in am   Leontine Locket, PA-C 05/19/2019 12:39 PM [phone]

## 2019-05-19 NOTE — Discharge Instructions (Signed)
  [facility]   Discharge Instructions  Endovascular Aortic Aneurysm Repair  Please refer to the following instructions for your post-procedure care. Your surgeon or Physician Assistant will discuss any changes with you.  Activity  You are encouraged to walk as much as you can. You can slowly return to normal activities but must avoid strenuous activity and heavy lifting until your doctor tells you it's OK. Avoid activities such as vacuuming or swinging a gold club. It is normal to feel tired for several weeks after your surgery. Do not drive until your doctor gives the OK and you are no longer taking prescription pain medications. It is also normal to have difficulty with sleep habits, eating, and bowel movements after surgery. These will go away with time.  Bathing/Showering  Shower daily after you go home.  Do not soak in a bathtub, hot tub, or swim until the incision heals completely.  If you have incisions in your groin, wash the groin wounds with soap and water daily and pat dry. (No tub bath-only shower)  Then put a dry gauze or washcloth there to keep this area dry to help prevent wound infection daily and as needed.  Do not use Vaseline or neosporin on your incisions.  Only use soap and water on your incisions and then protect and keep dry.  Incision Care  Shower every day. Clean your incision with mild soap and water. Pat the area dry with a clean towel. You do not need a bandage unless otherwise instructed. Do not apply any ointments or creams to your incision. If you clothing is irritating, you may cover your incision with a dry gauze pad.  Diet  Resume your normal diet. There are no special food restrictions following this procedure. A low fat/low cholesterol diet is recommended for all patients with vascular disease. In order to heal from your surgery, it is CRITICAL to get adequate nutrition. Your body requires vitamins, minerals, and protein.  Vegetables are the best source of vitamins and minerals. Vegetables also provide the perfect balance of protein. Processed food has little nutritional value, so try to avoid this.  Medications  Resume taking all of your medications unless your doctor or nurse practitioner tells you not to. If your incision is causing pain, you may take over-the-counter pain relievers such as acetaminophen (Tylenol). If you were prescribed a stronger pain medication, please be aware these medications can cause nausea and constipation. Prevent nausea by taking the medication with a snack or meal. Avoid constipation by drinking plenty of fluids and eating foods with a high amount of fiber, such as fruits, vegetables, and grains.  Do not take Tylenol if you are taking prescription pain medications.   Follow up  Our office will schedule a follow-up appointment with a CT scan 3-4 weeks after your surgery.  Please call us immediately for any of the following conditions  Severe or worsening pain in your legs or feet or in your abdomen back or chest. Increased pain, redness, drainage (pus) from your incision site. Increased abdominal pain, bloating, nausea, vomiting or persistent diarrhea. Fever of 101 degrees or higher. Swelling in your leg (s),  Reduce your risk of vascular disease  Stop smoking. If you would like help call [facility] at 1-800-QUIT-NOW (1-800-784-8669) or [place] at 336-586-4000. Manage your cholesterol Maintain a desired weight Control your diabetes Keep your blood pressure down  If you have questions, please call the office at [phone].  

## 2019-05-19 NOTE — Transfer of Care (Signed)
Immediate Anesthesia Transfer of Care Note  Patient: Dustin Vaughan  Procedure(s) Performed: ABDOMINAL AORTIC ENDOVASCULAR STENT GRAFT (N/A ) Ultrasound Guidance For Vascular Access  Patient Location: PACU  Anesthesia Type:General  Level of Consciousness: awake, alert , oriented and patient cooperative  Airway & Oxygen Therapy: Patient Spontanous Breathing  Post-op Assessment: Report given to RN and Post -op Vital signs reviewed and stable  Post vital signs: Reviewed and stable  Last Vitals:  Vitals Value Taken Time  BP 128/79 05/19/19 1136  Temp    Pulse 52 05/19/19 1139  Resp 13 05/19/19 1139  SpO2 97 % 05/19/19 1139  Vitals shown include unvalidated device data.  Last Pain:  Vitals:   05/19/19 0626  TempSrc:   PainSc: 0-No pain      Patients Stated Pain Goal: 2 (97/47/18 5501)  Complications: No apparent anesthesia complications

## 2019-05-19 NOTE — Interval H&P Note (Signed)
History and Physical Interval Note:  05/19/2019 7:31 AM  Dustin Vaughan  has presented today for surgery, with the diagnosis of ABDOMINAL AORTIC ANEURYSM.  The various methods of treatment have been discussed with the patient and family. After consideration of risks, benefits and other options for treatment, the patient has consented to  Procedure(s): ABDOMINAL AORTIC ENDOVASCULAR STENT GRAFT (N/A) Ultrasound Guidance For Vascular Access as a surgical intervention.  The patient's history has been reviewed, patient examined, no change in status, stable for surgery.  I have reviewed the patient's chart and labs.  Questions were answered to the patient's satisfaction.     Annamarie Major

## 2019-05-19 NOTE — Anesthesia Procedure Notes (Signed)
Arterial Line Insertion Start/End4/23/2021 7:15 AM Performed by: Trinna Post., CRNA, CRNA  Patient location: Pre-op. Preanesthetic checklist: patient identified, IV checked, site marked, risks and benefits discussed, surgical consent, monitors and equipment checked, pre-op evaluation, timeout performed and anesthesia consent Lidocaine 1% used for infiltration Right, radial was placed Catheter size: 20 G Hand hygiene performed  and maximum sterile barriers used   Attempts: 2 Procedure performed without using ultrasound guided technique. Following insertion, dressing applied and Biopatch. Post procedure assessment: normal  Post procedure complications: local hematoma. Patient tolerated the procedure well with no immediate complications.

## 2019-05-19 NOTE — Op Note (Signed)
Patient name: Dustin Vaughan MRN: [id number] DOB: 05/10/1932 Sex: male  05/19/2019 Pre-operative Diagnosis: AAA Post-operative diagnosis:  Same Surgeon:  Annamarie Major Assistants:  Leontine Locket Procedure:   #1: Endovascular repair of abdominal aortic aneurysm   #2: Iliac branch device   #3: Bilateral ultrasound-guided percutaneous access Anesthesia:  General Blood Loss:  Minimal Specimens:  none  Findings: Complete exclusion.  A total of 22 cc of contrast was utilized  Devices used: Main body was primary right Gore 31 x 14 x 13.  Ipsilateral right extension was a Gore 16 x 9.5.  Left side was a Gore 23 x 10 x 10 iliac branch device.  Left hypogastric stent was a VBX 11 x 59.  Left extension was a Gore 27 x 10  Indications: This is a 86 year old gentleman with chronic renal insufficiency that has a progressively enlarging abdominal aortic aneurysm now measuring 5.6 cm.  He underwent preoperative arteriogram with CO2 to better define his anatomy since he could not get a contrasted CT scan because of his renal function.  He comes in today for his repair.  Procedure:  The patient was identified in the holding area and taken to [place] [address]  The patient was then placed supine on the table. general anesthesia was administered.  The patient was prepped and draped in the usual sterile fashion.  A time out was called and antibiotics were administered.  Ultrasound was used to evaluate bilateral common femoral arteries which were widely patent without significant calcification.  A #11 blade was used to make a skin nick bilaterally.  Bilateral common femoral arteries were cannulated with a micropuncture needle.  A micropuncture sheath was then inserted.  Bentson wires were placed bilaterally.  The subcutaneous tract was dilated with an 8 French dilator and Pro-glide devices were deployed at the 11:00 and 1 o'clock position for preclosure.  8 French sheaths were placed bilaterally.  The  patient was fully heparinized.  Heparin levels were monitored throughout the case and redosed appropriately.  I first began by performing a retrograde injection through the sheath in the left groin to define the anatomy of the left iliac arteries.  Next, I confirmed that he was a candidate for iliac branch device.  Over Amplatz superstiff wires, a 2 French sheath was placed up the left side to the aortic bifurcation and a long 12 [place] Gore dry seal sheath was placed up the right.  Through the 16 French sheath on the left, the iliac branch device was inserted.  This was a Gore 23 x 10 x 10 device.  It was deployed such that the gate landed just above the origin of the left hypogastric artery. Next a Glidewire was advanced up the left side and was snared with a end snare from the right.  The wire was then brought out both sheaths as a rail.  The dilator for the 12 French sheath was then inserted and the 12 French sheath was brought over the aortic bifurcation into the iliac branch device.  I then used a Berenstein catheter and a Glidewire to get wire access out into the hypogastric artery and its branches.  A Rosen wire was then inserted.  A VBX 11 x 59 was then inserted and then deployed into the left hypogastric artery.  I then molded the overlapping area with a 14 x 40 Atlas balloon.  I had some difficulty getting the Atlas balloon out into the iliac branch but was ultimately successful  doing this over a Rosen wire.  After this was completed the remaining portion of the ipsilateral limb was deployed.  I did mold the limb with a MOD balloon at this point.  Next, the Glidewire was removed.  An Amplatz superstiff wire was then advanced up the 12 French sheath into the aorta.  The 12 French sheath was exchanged out for an 18 [place] sheath.  The main body device was then advanced up the right side.  This was a Gore 31 x 14 x 13 device.  An abdominal aortogram with CO2 was performed locating the left renal artery.   The device was then deployed at this level down to the contralateral gate.  The contralateral gate was then cannulated with a Motarjeme catheter and a Glidewire.  An Amplatz Super Stiff wire was then inserted and a Omni Flush catheter was then advanced into the main body of the device and able to be freely rotated, confirming successful cannulation.  A CO2 injection was then performed with the image detector in a left anterior oblique position locating the right hypogastric artery.  The remaining portion of the ipsilateral limb was then deployed and a ipsilateral extension was prepared and inserted this was a 16 x 9.5 device.  At this point proximal and distal attachment sites as well as device overlap were molded with a MOB balloon.  A completion arteriogram was then performed which showed widely patent left renal artery as well as bilateral hypogastric and bilateral external iliac arteries.  There was a type Ia endoleak.  I then repeated balloon angioplasty of the proximal device and a completion arteriogram was then performed which showed resolution of the type Ia endoleak.  Bentson wires were then inserted and the sheaths were removed, closing the arteriotomy site by securing the previously placed proglide devices.  50 mg of protamine was used to reverse the heparin the patient had brisk Doppler signals in both feet.  Cautery was used on the subcutaneous tissue and Dermabond was applied to to the incisions.  The patient tolerated the procedure well, was successfully extubated and taken to recovery in stable condition.  There were no immediate complications.   Disposition: To PACU stable.   Theotis Burrow, M.D., [facility] Office: [phone] Pager:  [phone]

## 2019-05-19 NOTE — Anesthesia Postprocedure Evaluation (Signed)
Anesthesia Post Note  Patient: Dustin Vaughan  Procedure(s) Performed: ABDOMINAL AORTIC ENDOVASCULAR STENT GRAFT (N/A ) Ultrasound Guidance For Vascular Access     Patient location during evaluation: PACU Anesthesia Type: General Level of consciousness: awake and alert Pain management: pain level controlled Vital Signs Assessment: post-procedure vital signs reviewed and stable Respiratory status: spontaneous breathing, nonlabored ventilation and respiratory function stable Cardiovascular status: blood pressure returned to baseline and stable Postop Assessment: no apparent nausea or vomiting Anesthetic complications: no    Last Vitals:  Vitals:   05/19/19 1330 05/19/19 1400  BP: (!) 145/95 (!) 144/74  Pulse:  (!) 55  Resp:  16  Temp:    SpO2:  95%    Last Pain:  Vitals:   05/19/19 1400  TempSrc:   PainSc: 8                  Kahner Yanik,W. EDMOND

## 2019-05-19 NOTE — Anesthesia Procedure Notes (Addendum)
Procedure Name: Intubation Date/Time: 05/19/2019 7:55 AM Performed by: Trinna Post., CRNA Pre-anesthesia Checklist: Patient identified, Emergency Drugs available, Suction available, Patient being monitored and Timeout performed Patient Re-evaluated:Patient Re-evaluated prior to induction Oxygen Delivery Method: Circle system utilized Preoxygenation: Pre-oxygenation with 100% oxygen Induction Type: IV induction Ventilation: Mask ventilation without difficulty Laryngoscope Size: Mac and 4 Grade View: Grade II Tube type: Oral Tube size: 7.5 mm Number of attempts: 1 Airway Equipment and Method: Stylet Placement Confirmation: ETT inserted through vocal cords under direct vision,  positive ETCO2 and breath sounds checked- equal and bilateral Secured at: 22 cm Tube secured with: Tape Dental Injury: Teeth and Oropharynx as per pre-operative assessment

## 2019-05-20 ENCOUNTER — Encounter (HOSPITAL_COMMUNITY): Payer: Self-pay | Admitting: Surgery

## 2019-05-20 LAB — CBC
HCT: 29 % — ABNORMAL LOW (ref 39.0–52.0)
Hemoglobin: 9.5 g/dL — ABNORMAL LOW (ref 13.0–17.0)
MCH: 30.9 pg (ref 26.0–34.0)
MCHC: 32.8 g/dL (ref 30.0–36.0)
MCV: 94.5 fL (ref 80.0–100.0)
Platelets: 128 10*3/uL — ABNORMAL LOW (ref 150–400)
RBC: 3.07 MIL/uL — ABNORMAL LOW (ref 4.22–5.81)
RDW: 15.7 % — ABNORMAL HIGH (ref 11.5–15.5)
WBC: 14.4 10*3/uL — ABNORMAL HIGH (ref 4.0–10.5)
nRBC: 0 % (ref 0.0–0.2)

## 2019-05-20 LAB — BASIC METABOLIC PANEL
Anion gap: 8 (ref 5–15)
Anion gap: 9 (ref 5–15)
Anion gap: 11 (ref 5–15)
BUN: 49 mg/dL — ABNORMAL HIGH (ref 8–23)
BUN: 49 mg/dL — ABNORMAL HIGH (ref 8–23)
BUN: 53 mg/dL — ABNORMAL HIGH (ref 8–23)
CO2: 18 mmol/L — ABNORMAL LOW (ref 22–32)
CO2: 17 mmol/L — ABNORMAL LOW (ref 22–32)
CO2: 17 mmol/L — ABNORMAL LOW (ref 22–32)
Calcium: 8.2 mg/dL — ABNORMAL LOW (ref 8.9–10.3)
Calcium: 8.3 mg/dL — ABNORMAL LOW (ref 8.9–10.3)
Calcium: 8.4 mg/dL — ABNORMAL LOW (ref 8.9–10.3)
Chloride: 111 mmol/L (ref 98–111)
Chloride: 110 mmol/L (ref 98–111)
Chloride: 107 mmol/L (ref 98–111)
Creatinine, Ser: 3.37 mg/dL — ABNORMAL HIGH (ref 0.61–1.24)
Creatinine, Ser: 3.36 mg/dL — ABNORMAL HIGH (ref 0.61–1.24)
Creatinine, Ser: 3.19 mg/dL — ABNORMAL HIGH (ref 0.61–1.24)
GFR calc Af Amer: 18 mL/min — ABNORMAL LOW (ref >60)
GFR calc Af Amer: 18 mL/min — ABNORMAL LOW (ref >60)
GFR calc Af Amer: 20 mL/min — ABNORMAL LOW (ref >60)
GFR calc non Af Amer: 16 mL/min — ABNORMAL LOW (ref >60)
GFR calc non Af Amer: 16 mL/min — ABNORMAL LOW (ref >60)
GFR calc non Af Amer: 17 mL/min — ABNORMAL LOW (ref >60)
Glucose, Bld: 128 mg/dL — ABNORMAL HIGH (ref 70–99)
Glucose, Bld: 113 mg/dL — ABNORMAL HIGH (ref 70–99)
Glucose, Bld: 111 mg/dL — ABNORMAL HIGH (ref 70–99)
Potassium: 6.3 mmol/L (ref 3.5–5.1)
Potassium: 6.3 mmol/L (ref 3.5–5.1)
Potassium: 5.2 mmol/L — ABNORMAL HIGH (ref 3.5–5.1)
Sodium: 137 mmol/L (ref 135–145)
Sodium: 136 mmol/L (ref 135–145)
Sodium: 135 mmol/L (ref 135–145)

## 2019-05-20 MED ORDER — SODIUM CHLORIDE 0.45 % IV SOLN: INTRAVENOUS | Status: DC

## 2019-05-20 MED ORDER — SODIUM ZIRCONIUM CYCLOSILICATE 10 G PO PACK
10.0000 g | PACK | Freq: Three times a day (TID) | ORAL | Status: DC
  Administered 2019-05-20 (×2): 10 g via ORAL
  Filled 2019-05-20 (×3): qty 1

## 2019-05-20 NOTE — Progress Notes (Signed)
Lab called and need to redraw a.m. labs. Not sure it is accurate.

## 2019-05-20 NOTE — Progress Notes (Signed)
    Subjective  - POD #1, s/p EVAR  No complaints this am Blood in urine yesterday   Physical Exam:  Palpable pedal pulses Both groins are intact without hematoma Abdomen is soft       Assessment/Plan:  POD #1  AAA: Patient doing well from the surgery.  I will plan on keeping him 1 more day for his other issues Hyperkalemia: Initial potassium was 6.3 today.  He does take medication for this at home which he held prior to surgery.  I will ask nephrology to come and evaluate him today.  No other interventions need to be performed Acute blood loss anemia: No active issues although he does have some hematuria, his hemoglobin remained stable Hematuria: We will continue to monitor this.  I suspect this is secondary to Foley manipulation while heparinized.  I anticipate this resolving over time.  Dustin Vaughan 05/20/2019 9:06 AM --  Vitals:   05/20/19 0200 05/20/19 0330  BP: 122/63 128/66  Pulse: (!) 44 (!) 46  Resp: 14 16  Temp: 97.8 F (36.6 C) 98.3 F (36.8 C)  SpO2: 96% 96%    Intake/Output Summary (Last 24 hours) at 05/20/2019 0906 Last data filed at 05/20/2019 0539 Gross per 24 hour  Intake 2010 ml  Output 1055 ml  Net 955 ml     Laboratory CBC    Component Value Date/Time   WBC 14.4 (H) 05/20/2019 0828   HGB 9.5 (L) 05/20/2019 0828   HGB 13.5 04/13/2018 1140   HCT 29.0 (L) 05/20/2019 0828   HCT 41.6 04/13/2018 1140   PLT 128 (L) 05/20/2019 0828   PLT 238 04/13/2018 1140    BMET    Component Value Date/Time   NA 137 05/20/2019 0431   NA 140 04/13/2018 1140   K 6.3 (HH) 05/20/2019 0431   CL 111 05/20/2019 0431   CO2 18 (L) 05/20/2019 0431   GLUCOSE 128 (H) 05/20/2019 0431   BUN 49 (H) 05/20/2019 0431   BUN 40 (H) 04/13/2018 1140   CREATININE 3.37 (H) 05/20/2019 0431   CALCIUM 8.2 (L) 05/20/2019 0431   GFRNONAA 16 (L) 05/20/2019 0431   GFRAA 18 (L) 05/20/2019 0431    COAG Lab Results  Component Value Date   INR 1.2 05/19/2019   INR 1.0  05/17/2019   No results found for: PTT  Antibiotics Anti-infectives (From admission, onward)   Start     Dose/Rate Route Frequency Ordered Stop   05/19/19 2200  ceFAZolin (ANCEF) IVPB 1 g/50 mL premix     1 g 100 mL/hr over 30 Minutes Intravenous Every 12 hours 05/19/19 1651 05/20/19 2159   05/19/19 0545  ceFAZolin (ANCEF) IVPB 2g/100 mL premix     2 g 200 mL/hr over 30 Minutes Intravenous 30 min pre-op 05/19/19 0545 05/19/19 0835       V. Leia Alf, M.D., [facility] [place] Office: [phone] Pager:  [phone]

## 2019-05-20 NOTE — Progress Notes (Signed)
Unable to reach lab or phlebotomy at either [facility] [phone] or  [phone]. Operator assisted without success. Reordered labs STAT. CBC and BMET needed. Pt no longer has central line.

## 2019-05-20 NOTE — Consult Note (Signed)
Renal Service Consult Note [facility]  [address] [place] 05/20/2019 Sol Bl[place] Requesting Physician:  Dr Trula Slade  Reason for Consult:  CKD IV pt w/ hyperkalemia after AAA procedure HPI: The patient is a 86 y.o. year-old w/ hx of HTN, HL, CKD IV, prostate Ca, CAD, COPD who was admitted on 4/23 for endovascular repair of 5.6 cm AAA. Pt had the procedure w/o complication yesterday, got 22 cc contrast.  K+ today is 6.3.  Repeat K+ was 6.3.  Creat is stable pre and post op in the 3- 3.5 range.  Asked to see for hyperkalemia.   Pt states had R nephrectomy for renal cancer in 1990's.  In 1998 had prostate Ca rx'd w/ radiation+ seed implants. No hx recurrence. He denies any voiding issues. No leg edema , SOB, cough or abd pain.    Pt is f/b Dr Posey Pronto at [facility]: [place] [facility] for CKD IV.     ROS  denies CP  no joint pain   no HA  no blurry vision  no rash  no diarrhea  no nausea/ vomiting  no dysuria  no difficulty voiding  no change in urine color    Past Medical History  Past Medical History:  Diagnosis Date  . Aneurysm, aortic ([place])   . Cancer of kidney ([place]) 1997   right removed   . Cataract   . COPD (chronic obstructive pulmonary disease) ([place])   . Coronary artery disease   . GERD (gastroesophageal reflux disease)   . Hemorrhoids, external without complications   . High blood pressure   . HTN (hypertension)   . Hypercholesteremia   . Hyperlipidemia   . Osteoarthritis   . Pneumonia   . Prostate cancer ([place])   . Renal insufficiency   . Vitamin D deficiency   . Weight loss    Past Surgical History  Past Surgical History:  Procedure Laterality Date  . ABDOMINAL AORTOGRAM W/LOWER EXTREMITY Bilateral 04/18/2019   Procedure: ABDOMINAL AORTOGRAM W/LOWER EXTREMITY;  Surgeon: Serafina Mitchell, MD;  Location: [facility];  Service: Cardiovascular;  Laterality: Bilateral;  . ANGIOPLASTY  02/26/2017  . COLON SURGERY    . COLONOSCOPY  12/09/2011   Colonic  polyp status post polypectomy. [place] predominantly sigmoid diverticulosis. Mid radiationinduced chronic changes in the rectum. Otherwise normal colonoscopy. Tubular adenoma.   [facility] KIDNEY SURGERY Right 1997   only have 1 kidney. Left pt has    Family History  Family History  Problem Relation Age of Onset  . Diabetes Mother   . Heart disease Father   . Heart failure Father   . Hyperlipidemia Father   . Hypertension Father   . Heart attack Father   . Stroke Father   . Colon cancer Neg Hx    Social History  reports that he has been smoking cigarettes. He has been smoking about 0.25 packs per day. He has never used smokeless tobacco. He reports previous alcohol use. He reports that he does not use drugs. Allergies No Known Allergies Home medications Prior to Admission medications   Medication Sig Start Date End Date Taking? Authorizing Provider  amLODipine (NORVASC) 10 MG tablet Take 10 mg by mouth daily.   Yes [provider]  aspirin EC 81 MG tablet Take 81 mg by mouth daily.   Yes [provider]  b complex vitamins tablet Take 1 tablet by mouth daily.   Yes [provider]  Calcium Carb-Cholecalciferol (CALCIUM 600+D) 600-800 MG-UNIT TABS Take 1 tablet  by mouth daily.   Yes [provider]  cholecalciferol (VITAMIN D3) 25 MCG (1000 UNIT) tablet Take 1,000 Units by mouth daily.   Yes [provider]  Coenzyme Q-10 200 MG CAPS Take 200 mg by mouth daily.   Yes [provider]  ferrous sulfate 325 (65 FE) MG tablet Take 325 mg by mouth daily.   Yes [provider]  fluticasone (FLONASE) 50 MCG/ACT nasal spray Place 1 spray into both nostrils daily as needed for allergies or rhinitis.   Yes [provider]  levothyroxine (SYNTHROID) 75 MCG tablet Take 37.5 mcg by mouth at bedtime.   Yes [provider]  metoprolol tartrate (LOPRESSOR) 50 MG tablet Take 50 mg by mouth 2 (two) times daily.   Yes [provider]  Omega-3 Fatty Acids (FISH OIL) 1000 MG CAPS Take 1,000 mg by mouth daily.   Yes [provider]  simvastatin (ZOCOR) 20 MG tablet Take 10 mg by mouth at bedtime.    Yes [provider]  vitamin B-12 (CYANOCOBALAMIN) 500 MCG tablet Take 500 mcg by mouth daily.   Yes [provider]  omeprazole (PRILOSEC) 20 MG capsule Take 1 capsule (20 mg total) by mouth daily. 07/08/18   Jackquline Denmark, MD     Vitals:   05/20/19 0007 05/20/19 0200 05/20/19 0330 05/20/19 1015  BP: 136/81 122/63 128/66 124/68  Pulse: (!) 45 (!) 44 (!) 46 (!) 58  Resp: 10 14 16    Temp: 97.6 F (36.4 C) 97.8 F (36.6 C) 98.3 F (36.8 C)   TempSrc: Oral Oral Oral   SpO2: 97% 96% 96%   Weight:      Height:       Exam Gen alert, no distress, calm, lying flat No rash, cyanosis or gangrene Sclera anicteric, throat clear  No jvd or bruits Chest clear bilat to bases no rales, wheezing or bronchial BS RRR no MRG Abd soft ntnd no mass or ascites +bs GU normal male, L groin wound stable MS no joint effusions or deformity Ext no LE or UE edema, no wounds or ulcers Neuro is alert, Ox 3 , nf    Home meds:  - norvasc 10/ metoprolol 50 bid  - zocor/ prilosec/ synthroid/ asa 81  - prn's/ vitamins/ supplements       Assessment/ Plan: 1. Hyperkalemia - acute/ chronic issue, has been taking K+ lowering meds at home for some time due to hx CKD IV.  Hasn't had his home Veltassa in "a couple days". Plan is for low K+(renal) diet, give modest IVF's for kaliuresis and give lokelma 10 tid for now.  Recheck K+ this evening and in the morning. Will follow.  2. CKD IV / hx R nephrectomy - looks euvolemic, stable renal fxn here, f/b Dr Posey Pronto 3. AAA - sp aorto-bilat iliac endovascular repair on 4/34 4. HTN - cont meds except hold metoprolol for HR < 60 5. CAD 6. COPD      Kelly Splinter  MD 05/20/2019, 12:04 PM  Recent Labs  Lab 05/19/19 1145 05/20/19 0828  WBC 9.3 14.4*  HGB 10.9*  9.5*   Recent Labs  Lab 05/17/19 1321 05/17/19 1321 05/19/19 1145 05/19/19 1145 05/20/19 0431 05/20/19 0828  K 5.3*   < > 5.1   < > 6.3* 6.3*  BUN 53*   < > 46*   < > 49* 49*  CREATININE 3.53*   < > 3.16*   < > 3.37* 3.36*  CALCIUM 8.9  --  8.6*  --  8.2* 8.3*   < > = values in this interval not displayed.

## 2019-05-20 NOTE — Progress Notes (Signed)
Patient H.R. going down in the 29 and 30's while sleeping only for a 2 or 3 bts. Then back up to upper 40's Patient awake with no complaints. Skin warm and dry. Patient H.R.was 60 at 2100 and he did receive Lopressor 50 mg p.o. Patient did have some S.B. In the 16's earlier tonight. Karen Kitchens. aware. Cont. To monitor patient and rhythm.

## 2019-05-21 LAB — BASIC METABOLIC PANEL
Anion gap: 6 (ref 5–15)
BUN: 51 mg/dL — ABNORMAL HIGH (ref 8–23)
CO2: 17 mmol/L — ABNORMAL LOW (ref 22–32)
Calcium: 8.1 mg/dL — ABNORMAL LOW (ref 8.9–10.3)
Chloride: 111 mmol/L (ref 98–111)
Creatinine, Ser: 3.29 mg/dL — ABNORMAL HIGH (ref 0.61–1.24)
GFR calc Af Amer: 19 mL/min — ABNORMAL LOW (ref >60)
GFR calc non Af Amer: 16 mL/min — ABNORMAL LOW (ref >60)
Glucose, Bld: 97 mg/dL (ref 70–99)
Potassium: 5.6 mmol/L — ABNORMAL HIGH (ref 3.5–5.1)
Sodium: 134 mmol/L — ABNORMAL LOW (ref 135–145)

## 2019-05-21 MED ORDER — OXYCODONE-ACETAMINOPHEN 5-325 MG PO TABS: 1.0000 | ORAL_TABLET | Freq: Three times a day (TID) | ORAL | 0 refills | Status: DC | PRN

## 2019-05-21 MED ORDER — PATIROMER SORBITEX CALCIUM 8.4 G PO PACK: 8.4000 g | PACK | Freq: Every day | ORAL | Status: DC

## 2019-05-21 NOTE — Discharge Summary (Signed)
EVAR Discharge Summary   Dustin Vaughan Dustin Vaughan 12-18-1932 86 y.o. male  MRN: [id number]  Admission Date: 05/19/2019  Discharge Date: 05/21/2019  Physician: Serafina Mitchell, MD  Admission Diagnosis: AAA (abdominal aortic aneurysm) [facility]) [I71.4]   HPI:   This is a 86 y.o. male  who comes back today for further discussions regarding his abdominal aortic aneurysm.  Maximum aortic diameter is 5.6.  This is an increase from 5.23 months ago.  He recently underwent CO2 angiogram to better define his anatomy given his chronic renal insufficiency.  He is here today for surgical planning.  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 He is medically managed for hypertension. He is a current smoker. He is on a statin for hypercholesterolemia. He is on chronic anticoagulation. He has chronic renal insufficiency. His most recent creatinine was in the 2.3 range.  He recently describes 50 pound weight loss. He is undergone upper endoscopy, but not lower endoscopy. He also reports blood in his stool.  Hospital Course:  The patient was admitted to the hospital and taken to the operating room on 05/19/2019 and underwent: Procedure:    #1: Endovascular repair of abdominal aortic aneurysm #2: Iliac branch device #3: Bilateral ultrasound-guided percutaneous access    Findings: Findings: Complete exclusion.  A total of 22 cc of contrast was utilized  The pt tolerated the procedure well and was transported to the PACU in good condition.   By POD 1, pt was doing well.  He did have some hyperkalemia with K+ of 6.3.  He takes medication at home for this but it was help preoperatively.  He did have acute blood loss anemia but no active issues except for some hematuria.  His hgb remained stable.  It was felt that this was due to the foley insertion.  This did clear by discharge.   Renal was consulted for his hyperkalemia.   POD 2,  He was doing well and  had ambulated.  His K+ was 5.6.  He had palpable pedal pulses and groins soft without hematoma.  Abdomen soft and non tender.  Spoke with Dr. Melvia Heaps and pt is okay for discharge and pt will restart his Veltassa today per renal.    The remainder of the hospital course consisted of increasing mobilization and increasing intake of solids without difficulty.  CBC    Component Value Date/Time   WBC 14.4 (H) 05/20/2019 0828   RBC 3.07 (L) 05/20/2019 0828   HGB 9.5 (L) 05/20/2019 0828   HGB 13.5 04/13/2018 1140   HCT 29.0 (L) 05/20/2019 0828   HCT 41.6 04/13/2018 1140   PLT 128 (L) 05/20/2019 0828   PLT 238 04/13/2018 1140   MCV 94.5 05/20/2019 0828   MCV 95 04/13/2018 1140   MCH 30.9 05/20/2019 0828   MCHC 32.8 05/20/2019 0828   RDW 15.7 (H) 05/20/2019 0828   RDW 13.6 04/13/2018 1140    BMET    Component Value Date/Time   NA 134 (L) 05/21/2019 0241   NA 140 04/13/2018 1140   K 5.6 (H) 05/21/2019 0241   CL 111 05/21/2019 0241   CO2 17 (L) 05/21/2019 0241   GLUCOSE 97 05/21/2019 0241   BUN 51 (H) 05/21/2019 0241   BUN 40 (H) 04/13/2018 1140   CREATININE 3.29 (H) 05/21/2019 0241   CALCIUM 8.1 (L) 05/21/2019 0241   GFRNONAA 16 (L) 05/21/2019 0241   GFRAA 19 (L) 05/21/2019 0241  Discharge Instructions    Discharge patient   Complete by: As directed    Discharge disposition: 01-Home or Self Care   Discharge patient date: 05/21/2019      Discharge Diagnosis:  AAA (abdominal aortic aneurysm) [facility]) [I71.4]  Secondary Diagnosis: Patient Active Problem List   Diagnosis Date Noted  . AAA (abdominal aortic aneurysm) ([place]) 05/19/2019  . Preoperative cardiovascular examination 05/15/2019  . Loss of weight 07/09/2018  . Carcinoma of kidney ([place]) 10/22/2017  . Kidney cysts 10/22/2017  . Chronic kidney disease 10/20/2016  . Acquired absence of kidney 10/20/2016  . Abdominal aortic aneurysm (AAA) without rupture ([place]) 09/15/2016  . 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 Smoker 09/15/2016  . Coronary artery disease involving native coronary artery of native heart without angina pectoris 09/26/2014  . Essential hypertension 09/26/2014  . Mixed hyperlipidemia 09/26/2014   Past Medical History:  Diagnosis Date  . Aneurysm, aortic ([place])   . Cancer of kidney ([place]) 1997   right removed   . Cataract   . COPD (chronic obstructive pulmonary disease) ([place])   . Coronary artery disease   . GERD (gastroesophageal reflux disease)   . Hemorrhoids, external without complications   . High blood pressure   . HTN (hypertension)   . Hypercholesteremia   . Hyperlipidemia   . Osteoarthritis   . Pneumonia   . Prostate cancer ([place])   . Renal insufficiency   . Vitamin D deficiency   . Weight loss      Allergies as of 05/21/2019   No Known Allergies     Medication List    TAKE these medications   amLODipine 10 MG tablet Commonly known as: NORVASC Take 10 mg by mouth daily.   aspirin EC 81 MG tablet Take 81 mg by mouth daily.   b complex vitamins tablet Take 1 tablet by mouth daily.   Calcium 600+D 600-800 MG-UNIT Tabs Generic drug: Calcium Carb-Cholecalciferol Take 1 tablet by mouth daily.   cholecalciferol 25 MCG (1000 UNIT) tablet Commonly known as: VITAMIN D3 Take 1,000 Units by mouth daily.   Coenzyme Q-10 200 MG Caps Take 200 mg by mouth daily.   ferrous sulfate 325 (65 FE) MG tablet Take 325 mg by mouth daily.   Fish Oil 1000 MG Caps Take 1,000 mg by mouth daily.   fluticasone 50 MCG/ACT nasal spray Commonly known as: FLONASE Place 1 spray into both nostrils daily as needed for allergies or rhinitis.   levothyroxine 75 MCG tablet Commonly known as: SYNTHROID Take 37.5 mcg by mouth at bedtime.   metoprolol tartrate 50 MG tablet Commonly known as: LOPRESSOR Take 50 mg by mouth 2 (two) times daily.   omeprazole 20 MG capsule Commonly known as: PRILOSEC Take 1 capsule (20 mg total) by mouth daily.   oxyCODONE-acetaminophen  5-325 MG tablet Commonly known as: PERCOCET/ROXICET Take 1 tablet by mouth every 8 (eight) hours as needed for moderate pain.   patiromer 8.4 g packet Commonly known as: VELTASSA Take 1 packet (8.4 g total) by mouth daily. Start taking on: May 22, 2019   simvastatin 20 MG tablet Commonly known as: ZOCOR Take 10 mg by mouth at bedtime.   vitamin B-12 500 MCG tablet Commonly known as: CYANOCOBALAMIN Take 500 mcg by mouth daily.       Discharge Instructions:  [facility]  Discharge Instructions Endovascular Aortic Aneurysm Repair  Please refer to the following instructions for your post-procedure care. Your surgeon or Physician  Assistant will discuss any changes with you.  Activity  You are encouraged to walk as much as you can. You can slowly return to normal activities but must avoid strenuous activity and heavy lifting until your doctor tells you it's OK. Avoid activities such as vacuuming or swinging a gold club. It is normal to feel tired for several weeks after your surgery. Do not drive until your doctor gives the OK and you are no longer taking prescription pain medications. It is also normal to have difficulty with sleep habits, eating, and bowel movements after surgery. These will go away with time.  Bathing/Showering  You may shower after you go home. If you have an incision, do not soak in a bathtub, hot tub, or swim until the incision heals completely.  Incision Care  Shower every day. Clean your incision with mild soap and water. Pat the area dry with a clean towel. You do not need a bandage unless otherwise instructed. Do not apply any ointments or creams to your incision. If you clothing is irritating, you may cover your incision with a dry gauze pad.  Diet  Resume your normal diet. There are no special food restrictions following this procedure. A low fat/low cholesterol diet is recommended for all patients with vascular  disease. In order to heal from your surgery, it is CRITICAL to get adequate nutrition. Your body requires vitamins, minerals, and protein. Vegetables are the best source of vitamins and minerals. Vegetables also provide the perfect balance of protein. Processed food has little nutritional value, so try to avoid this.  Medications  Resume taking all of your medications unless your doctor or Physician Assistnat tells you not to. If your incision is causing pain, you may take over-the-counter pain relievers such as acetaminophen (Tylenol). If you were prescribed a stronger pain medication, please be aware these medications can cause nausea and constipation. Prevent nausea by taking the medication with a snack or meal. Avoid constipation by drinking plenty of fluids and eating foods with a high amount of fiber, such as fruits, vegetables, and grains.  Do not take Tylenol if you are taking prescription pain medications.   Follow up  [place] office will schedule a follow-up appointment with a C.T. scan 3-4 weeks after your surgery.  Please call us immediately for any of the following conditions  . Severe or worsening pain in your legs or feet or in your abdomen back or chest. . Increased pain, redness, drainage (pus) from your incision sit. . Increased abdominal pain, bloating, nausea, vomiting or persistent diarrhea. . Fever of 101 degrees or higher. . Swelling in your leg (s), .  Reduce your risk of vascular disease  .Stop smoking. If you would like help call [facility] at 1-800-QUIT-NOW 305-315-1369) or [place] at [phone]. .Manage your cholesterol .Maintain a desired weight .Control your diabetes .Keep your blood pressure down  If you have questions, please call the office at [phone].   Prescriptions given: 1.  Roxicet #8 No Refill  Disposition: home  Patient's condition: is Good  Follow up: 1. Dr. Trula Slade in 4 weeks with non contrast CT scan   Leontine Locket,  PA-C [facility] [phone] 05/21/2019  10:23 AM   - For VQI Registry use - Post-op:  Time to Extubation: [x]  In OR, [ ]  < 12 hrs, [ ]  12-24 hrs, [ ]  >=24 hrs Vasopressors Req. Post-op: No MI: No., [ ]  Troponin only, [ ]  EKG or Clinical New Arrhythmia: No CHF: No ICU  Stay: 2 day in progressive Transfusion: No     If yes, n/a units given  Complications: Resp failure: No., [ ]  Pneumonia, [ ]  Ventilator Chg in renal function: No., [ ]  Inc. Cr > 0.5, [ ]  Temp. Dialysis,  [ ]  Permanent dialysis Leg ischemia: No., no Surgery needed, [ ]  Yes, Surgery needed,  [ ]  Amputation Bowel ischemia: No., [ ]  Medical Rx, [ ]  Surgical Rx Wound complication: No., [ ]  Superficial separation/infection, [ ]  Return to OR Return to OR: No  Return to OR for bleeding: No Stroke: No., [ ]  Minor, [ ]  Major  Discharge medications: Statin use:  Yes  ASA use:  Yes  Plavix use:  No  Beta blocker use:  Yes  ARB use:  No ACEI use:  No CCB use:  Yes

## 2019-05-21 NOTE — Progress Notes (Signed)
[facility] Progress Note  Subjective: doing well, K 5.6 this am  Vitals:   05/20/19 2048 05/20/19 2351 05/21/19 0521 05/21/19 0906  BP: 116/61 (!) 135/55 (!) 121/58 (!) 126/56  Pulse: (!) 53 (!) 59 (!) 47 (!) 52  Resp: 20 20 17 14   Temp: 98.5 F (36.9 C) (!) 97.5 F (36.4 C) 98.3 F (36.8 C) 98.4 F (36.9 C)  TempSrc: Oral Oral Oral Oral  SpO2: 95% 95% 94% 93%  Weight:      Height:        Exam: Gen alert, no distress, calm, lying flat No rash, cyanosis or gangrene Sclera anicteric, throat clear  No jvd or bruits Chest clear bilat to bases no rales, wheezing or bronchial BS RRR no MRG Abd soft ntnd no mass or ascites +bs GU normal male, L groin wound stable MS no joint effusions or deformity Ext no LE or UE edema, no wounds or ulcers Neuro is alert, Ox 3 , nf    Home meds:  - norvasc 10/ metoprolol 50 bid  - zocor/ prilosec/ synthroid/ asa 81  - prn's/ vitamins/ supplements     Assessment/ Plan: 1. Hyperkalemia - acute/ chronic issue, has been taking K+ lowering meds at home for some time due to hx CKD IV.  Hasn't had his home Veltassa for a day or two due to the surgery, K+ up to 6.3 here. Pt got renal diet and Lokelma and K+ down to 5.6 today. Pt will resume Veltassa 8.4 gm qd when he gets home today for long-term K+ control.  OK for dc from renal standpoint. Pt will f/u w/ Dr Posey Pronto and will go to [facility] for lab check later this week.  2. CKD IV / hx R nephrectomy - looks euvolemic, stable renal fxn here, f/b Dr Posey Pronto 3. AAA - sp aorto-bilat iliac endovascular repair on 4/34 4. HTN - cont meds except hold metoprolol for HR < 60 5. CAD 6. COPD     Rob Sarrinah Gardin 05/21/2019, 10:14 AM   Recent Labs  Lab 05/19/19 1145 05/20/19 0431 05/20/19 0828 05/20/19 0828 05/20/19 1816 05/21/19 0241  K 5.1   < > 6.3*   < > 5.2* 5.6*  BUN 46*   < > 49*   < > 53* 51*  CREATININE 3.16*   < > 3.36*   < > 3.19* 3.29*  CALCIUM 8.6*   < > 8.3*   < > 8.4*  8.1*  HGB 10.9*  --  9.5*  --   --   --    < > = values in this interval not displayed.   Inpatient medications: . amLODipine  10 mg Oral Daily  . aspirin EC  81 mg Oral Daily  . Chlorhexidine Gluconate Cloth  6 each Topical Daily  . docusate sodium  100 mg Oral Daily  . ferrous sulfate  325 mg Oral Daily  . heparin  5,000 Units Subcutaneous Q8H  . levothyroxine  37.5 mcg Oral QHS  . metoprolol tartrate  50 mg Oral BID  . pantoprazole  40 mg Oral Daily  . [START ON 05/22/2019] patiromer  8.4 g Oral Daily  . simvastatin  10 mg Oral QHS   . magnesium sulfate bolus IVPB     acetaminophen **OR** acetaminophen, bisacodyl, fluticasone, guaiFENesin-dextromethorphan, hydrALAZINE, HYDROmorphone (DILAUDID) injection, labetalol, magnesium sulfate bolus IVPB, metoprolol tartrate, ondansetron, oxyCODONE-acetaminophen, phenol, polyethylene glycol

## 2019-05-21 NOTE — Progress Notes (Signed)
    Subjective  - POD #2  Feels good Walked in halls Urine clear   Physical Exam:  Palpable pedla pulses Groins ok abd soft        Assessment/Plan:  POD #2  Will check with renal regarding potassium, but anticipate discharge today  [place]s Luanne Krzyzanowski 05/21/2019 7:23 AM --  Vitals:   05/20/19 2351 05/21/19 0521  BP: (!) 135/55 (!) 121/58  Pulse: (!) 59 (!) 47  Resp: 20 17  Temp: (!) 97.5 F (36.4 C) 98.3 F (36.8 C)  SpO2: 95% 94%    Intake/Output Summary (Last 24 hours) at 05/21/2019 0723 Last data filed at 05/21/2019 0133 Gross per 24 hour  Intake 1112.43 ml  Output --  Net 1112.43 ml     Laboratory CBC    Component Value Date/Time   WBC 14.4 (H) 05/20/2019 0828   HGB 9.5 (L) 05/20/2019 0828   HGB 13.5 04/13/2018 1140   HCT 29.0 (L) 05/20/2019 0828   HCT 41.6 04/13/2018 1140   PLT 128 (L) 05/20/2019 0828   PLT 238 04/13/2018 1140    BMET    Component Value Date/Time   NA 134 (L) 05/21/2019 0241   NA 140 04/13/2018 1140   K 5.6 (H) 05/21/2019 0241   CL 111 05/21/2019 0241   CO2 17 (L) 05/21/2019 0241   GLUCOSE 97 05/21/2019 0241   BUN 51 (H) 05/21/2019 0241   BUN 40 (H) 04/13/2018 1140   CREATININE 3.29 (H) 05/21/2019 0241   CALCIUM 8.1 (L) 05/21/2019 0241   GFRNONAA 16 (L) 05/21/2019 0241   GFRAA 19 (L) 05/21/2019 0241    COAG Lab Results  Component Value Date   INR 1.2 05/19/2019   INR 1.0 05/17/2019   No results found for: PTT  Antibiotics Anti-infectives (From admission, onward)   Start     Dose/Rate Route Frequency Ordered Stop   05/19/19 2200  ceFAZolin (ANCEF) IVPB 1 g/50 mL premix     1 g 100 mL/hr over 30 Minutes Intravenous Every 12 hours 05/19/19 1651 05/20/19 1045   05/19/19 0545  ceFAZolin (ANCEF) IVPB 2g/100 mL premix     2 g 200 mL/hr over 30 Minutes Intravenous 30 min pre-op 05/19/19 0545 05/19/19 0835       V. Leia Alf, M.D., [facility] [place] Office:  [phone] Pager:  [phone]

## 2019-05-22 ENCOUNTER — Encounter: Payer: Self-pay | Admitting: *Deleted

## 2019-05-22 DIAGNOSIS — F17210 Nicotine dependence, cigarettes, uncomplicated: Secondary | ICD-10-CM | POA: Diagnosis not present

## 2019-05-22 DIAGNOSIS — M1990 Unspecified osteoarthritis, unspecified site: Secondary | ICD-10-CM | POA: Diagnosis not present

## 2019-05-22 DIAGNOSIS — Z48812 Encounter for surgical aftercare following surgery on the circulatory system: Secondary | ICD-10-CM | POA: Diagnosis not present

## 2019-05-22 DIAGNOSIS — Z8546 Personal history of malignant neoplasm of prostate: Secondary | ICD-10-CM | POA: Diagnosis not present

## 2019-05-22 DIAGNOSIS — J449 Chronic obstructive pulmonary disease, unspecified: Secondary | ICD-10-CM | POA: Diagnosis not present

## 2019-05-22 DIAGNOSIS — I2510 Atherosclerotic heart disease of native coronary artery without angina pectoris: Secondary | ICD-10-CM | POA: Diagnosis not present

## 2019-05-22 DIAGNOSIS — I129 Hypertensive chronic kidney disease with stage 1 through stage 4 chronic kidney disease, or unspecified chronic kidney disease: Secondary | ICD-10-CM | POA: Diagnosis not present

## 2019-05-22 DIAGNOSIS — Z85528 Personal history of other malignant neoplasm of kidney: Secondary | ICD-10-CM | POA: Diagnosis not present

## 2019-05-22 DIAGNOSIS — Z905 Acquired absence of kidney: Secondary | ICD-10-CM | POA: Diagnosis not present

## 2019-05-22 DIAGNOSIS — R634 Abnormal weight loss: Secondary | ICD-10-CM | POA: Diagnosis not present

## 2019-05-22 DIAGNOSIS — N189 Chronic kidney disease, unspecified: Secondary | ICD-10-CM | POA: Diagnosis not present

## 2019-05-24 ENCOUNTER — Other Ambulatory Visit: Payer: Self-pay

## 2019-05-24 DIAGNOSIS — I129 Hypertensive chronic kidney disease with stage 1 through stage 4 chronic kidney disease, or unspecified chronic kidney disease: Secondary | ICD-10-CM | POA: Diagnosis not present

## 2019-05-24 DIAGNOSIS — N183 Chronic kidney disease, stage 3 unspecified: Secondary | ICD-10-CM | POA: Diagnosis not present

## 2019-05-24 DIAGNOSIS — R634 Abnormal weight loss: Secondary | ICD-10-CM | POA: Diagnosis not present

## 2019-05-24 DIAGNOSIS — I2510 Atherosclerotic heart disease of native coronary artery without angina pectoris: Secondary | ICD-10-CM | POA: Diagnosis not present

## 2019-05-24 DIAGNOSIS — E875 Hyperkalemia: Secondary | ICD-10-CM | POA: Diagnosis not present

## 2019-05-24 DIAGNOSIS — Z48812 Encounter for surgical aftercare following surgery on the circulatory system: Secondary | ICD-10-CM | POA: Diagnosis not present

## 2019-05-24 DIAGNOSIS — I714 Abdominal aortic aneurysm, without rupture, unspecified: Secondary | ICD-10-CM

## 2019-05-24 DIAGNOSIS — Z09 Encounter for follow-up examination after completed treatment for conditions other than malignant neoplasm: Secondary | ICD-10-CM | POA: Diagnosis not present

## 2019-05-24 DIAGNOSIS — Z8679 Personal history of other diseases of the circulatory system: Secondary | ICD-10-CM | POA: Diagnosis not present

## 2019-05-24 DIAGNOSIS — N189 Chronic kidney disease, unspecified: Secondary | ICD-10-CM | POA: Diagnosis not present

## 2019-05-24 DIAGNOSIS — J449 Chronic obstructive pulmonary disease, unspecified: Secondary | ICD-10-CM | POA: Diagnosis not present

## 2019-05-25 DIAGNOSIS — N184 Chronic kidney disease, stage 4 (severe): Secondary | ICD-10-CM | POA: Diagnosis not present

## 2019-05-26 DIAGNOSIS — J449 Chronic obstructive pulmonary disease, unspecified: Secondary | ICD-10-CM | POA: Diagnosis not present

## 2019-05-26 DIAGNOSIS — R634 Abnormal weight loss: Secondary | ICD-10-CM | POA: Diagnosis not present

## 2019-05-26 DIAGNOSIS — N189 Chronic kidney disease, unspecified: Secondary | ICD-10-CM | POA: Diagnosis not present

## 2019-05-26 DIAGNOSIS — I129 Hypertensive chronic kidney disease with stage 1 through stage 4 chronic kidney disease, or unspecified chronic kidney disease: Secondary | ICD-10-CM | POA: Diagnosis not present

## 2019-05-26 DIAGNOSIS — I2510 Atherosclerotic heart disease of native coronary artery without angina pectoris: Secondary | ICD-10-CM | POA: Diagnosis not present

## 2019-05-26 DIAGNOSIS — Z48812 Encounter for surgical aftercare following surgery on the circulatory system: Secondary | ICD-10-CM | POA: Diagnosis not present

## 2019-05-29 ENCOUNTER — Other Ambulatory Visit: Payer: Self-pay | Admitting: *Deleted

## 2019-05-29 ENCOUNTER — Encounter: Payer: Self-pay | Admitting: *Deleted

## 2019-05-29 NOTE — Patient Outreach (Signed)
[place] [facility]) Care Management [facility] CM Telephone Outreach, EMMI Red-Alert notification/ General Discharge PCP office completes Transition of Care follow up post hospital discharge Post-hospital discharge day # 8  05/29/2019  Mitchell [birth year] [id number]  EMMI Red-Alert notification/ General Discharge EMMI call date/ day #: Friday, May 26, 2019; day # 4 Red-Alert reason(s): "Other questions/ problems"  Successful telephone outreach to [employer], 86 y/o male referred to [facility] RN CM this morning by [facility] CMA for [place] notification, as above.  Patient had recent hospitalization April 21-25, 2021 for planned/ elective surgical AAA repair; he was discharged home to self-care.  Patient has history including, but not limited to, HTN/ HLD; CAD; CKD/ kidney cancer; GERD; COPD with ongoing tobacco use.  HIPAA/ identity verified; purpose of call/ 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  Patient denies pain, new/ recent falls, and confirms that he has scheduled follow up appointment with surgical provider.  He sounds to be in no distress throughout phone call today; screening call was successfully completed and no care coordination/ care management/ disease management needs were identified.  Patient further reports:  -- no concerns around medications; declines medication review today, stating no changes were made at time of hospital discharge; reports has not needed to take any pain medication post- hospital discharge stating "I was never in any  pain."  Reports self-manages medications, takes medications directly from prescription bottles and denies issues around swallowing  Medications  -- Denies community resource needs: described self as completely independent; still works/ drives/ stays active; plays golf and does his own lawn care.  SDOH completed for: depression/ transportation/ and food insecurity.  Confirms that he has resumed driving, and has in fact driven several times post-hospital discharge, and "did fine, no problems;" reports surgeon approved driving post-hospital discharge.  Reports "so many friends" in and out, offering their assistance, however, patient reports, "I don't need anything; I am doing everything for myself."  -- was discharged home from hospital with home health services; reports nurse has visited several times, and may sign off soon, as "she told me I was doing so well, and she couldn't believe how good I am doing."  Confirms that he has contact information for home health RN and expects "at least one more visit" this week.  Patient denies further issues, concerns, or problems today.  I provided/ confirmed that patient has my direct phone number, the main [facility] CM office phone number, and the [facility] CM 24-hour nurse advice phone number should issues arise and patient wish to participate in [place] services.  Plan:  Will make patient inactive with [facility] CM, as he has declined ongoing participation in [place] program and no needs were identified as a result of today's screening call; will make patient's PCP aware of same.  Oneta Rack, RN, BSN, [employer] [facility] Care Management  [phone]  '

## 2019-05-30 DIAGNOSIS — I129 Hypertensive chronic kidney disease with stage 1 through stage 4 chronic kidney disease, or unspecified chronic kidney disease: Secondary | ICD-10-CM | POA: Diagnosis not present

## 2019-05-30 DIAGNOSIS — Z48812 Encounter for surgical aftercare following surgery on the circulatory system: Secondary | ICD-10-CM | POA: Diagnosis not present

## 2019-05-30 DIAGNOSIS — I2510 Atherosclerotic heart disease of native coronary artery without angina pectoris: Secondary | ICD-10-CM | POA: Diagnosis not present

## 2019-05-30 DIAGNOSIS — N189 Chronic kidney disease, unspecified: Secondary | ICD-10-CM | POA: Diagnosis not present

## 2019-05-30 DIAGNOSIS — J449 Chronic obstructive pulmonary disease, unspecified: Secondary | ICD-10-CM | POA: Diagnosis not present

## 2019-05-30 DIAGNOSIS — R634 Abnormal weight loss: Secondary | ICD-10-CM | POA: Diagnosis not present

## 2019-05-31 ENCOUNTER — Telehealth: Payer: Self-pay

## 2019-05-31 DIAGNOSIS — N184 Chronic kidney disease, stage 4 (severe): Secondary | ICD-10-CM | POA: Diagnosis not present

## 2019-05-31 DIAGNOSIS — N2581 Secondary hyperparathyroidism of renal origin: Secondary | ICD-10-CM | POA: Diagnosis not present

## 2019-05-31 DIAGNOSIS — I129 Hypertensive chronic kidney disease with stage 1 through stage 4 chronic kidney disease, or unspecified chronic kidney disease: Secondary | ICD-10-CM | POA: Diagnosis not present

## 2019-05-31 DIAGNOSIS — D631 Anemia in chronic kidney disease: Secondary | ICD-10-CM | POA: Diagnosis not present

## 2019-05-31 NOTE — Telephone Encounter (Signed)
-----   Message from Serafina Mitchell, MD sent at 05/31/2019  1:42 PM EDT ----- Regarding: RE: [place]: [phone] [place] thanks. ----- Message ----- From: Kaleen Mask, LPN Sent: 10/04/2639  58:30 PM EDT To: Serafina Mitchell, MD Subject: Home Health                                    Just FYI: Earnest Bailey from [place] left a voice message saying patient has petechia on his right lower leg with no pain and itching. He has an appt to see you on 05/24.  Thanks,  Thurston Hole., LPN

## 2019-06-01 DIAGNOSIS — N189 Chronic kidney disease, unspecified: Secondary | ICD-10-CM | POA: Diagnosis not present

## 2019-06-01 DIAGNOSIS — Z48812 Encounter for surgical aftercare following surgery on the circulatory system: Secondary | ICD-10-CM | POA: Diagnosis not present

## 2019-06-01 DIAGNOSIS — I2510 Atherosclerotic heart disease of native coronary artery without angina pectoris: Secondary | ICD-10-CM | POA: Diagnosis not present

## 2019-06-01 DIAGNOSIS — R634 Abnormal weight loss: Secondary | ICD-10-CM | POA: Diagnosis not present

## 2019-06-01 DIAGNOSIS — J449 Chronic obstructive pulmonary disease, unspecified: Secondary | ICD-10-CM | POA: Diagnosis not present

## 2019-06-01 DIAGNOSIS — I129 Hypertensive chronic kidney disease with stage 1 through stage 4 chronic kidney disease, or unspecified chronic kidney disease: Secondary | ICD-10-CM | POA: Diagnosis not present

## 2019-06-02 DIAGNOSIS — N189 Chronic kidney disease, unspecified: Secondary | ICD-10-CM | POA: Diagnosis not present

## 2019-06-02 DIAGNOSIS — I2510 Atherosclerotic heart disease of native coronary artery without angina pectoris: Secondary | ICD-10-CM | POA: Diagnosis not present

## 2019-06-02 DIAGNOSIS — R634 Abnormal weight loss: Secondary | ICD-10-CM | POA: Diagnosis not present

## 2019-06-02 DIAGNOSIS — J449 Chronic obstructive pulmonary disease, unspecified: Secondary | ICD-10-CM | POA: Diagnosis not present

## 2019-06-02 DIAGNOSIS — I129 Hypertensive chronic kidney disease with stage 1 through stage 4 chronic kidney disease, or unspecified chronic kidney disease: Secondary | ICD-10-CM | POA: Diagnosis not present

## 2019-06-02 DIAGNOSIS — Z48812 Encounter for surgical aftercare following surgery on the circulatory system: Secondary | ICD-10-CM | POA: Diagnosis not present

## 2019-06-14 ENCOUNTER — Other Ambulatory Visit: Payer: Self-pay

## 2019-06-14 ENCOUNTER — Ambulatory Visit
Admission: RE | Admit: 2019-06-14 | Discharge: 2019-06-14 | Disposition: A | Discharge status: Home / Self Care | Payer: Medicare Other | Source: Ambulatory Visit | Attending: Surgery | Admitting: Surgery

## 2019-06-14 DIAGNOSIS — I714 Abdominal aortic aneurysm, without rupture, unspecified: Secondary | ICD-10-CM

## 2019-06-14 DIAGNOSIS — N281 Cyst of kidney, acquired: Secondary | ICD-10-CM | POA: Diagnosis not present

## 2019-06-14 DIAGNOSIS — K5730 Diverticulosis of large intestine without perforation or abscess without bleeding: Secondary | ICD-10-CM | POA: Diagnosis not present

## 2019-06-19 ENCOUNTER — Encounter: Payer: Self-pay | Admitting: Surgery

## 2019-06-19 ENCOUNTER — Ambulatory Visit (INDEPENDENT_AMBULATORY_CARE_PROVIDER_SITE_OTHER): Payer: Self-pay | Admitting: Surgery

## 2019-06-19 ENCOUNTER — Other Ambulatory Visit: Payer: Self-pay

## 2019-06-19 VITALS — BP 154/68 | HR 53 | Temp 98.1°F | Resp 20 | Ht 66.0 in | Wt 139.0 lb

## 2019-06-19 DIAGNOSIS — I714 Abdominal aortic aneurysm, without rupture, unspecified: Secondary | ICD-10-CM

## 2019-06-19 NOTE — Progress Notes (Signed)
Patient name: Dustin Vaughan MRN: [id number] DOB: 04-13-1932 Sex: male  REASON FOR VISIT:  Postop     HISTORY OF PRESENT ILLNESS:   Dustin Vaughan is a 86 y.o. male who is status post endovascular repair of a 5.6 cm infrarenal abdominal aortic aneurysm using a iliac branch device.  He has chronic renal insufficiency and so a total of 22 cc of contrast was utilized.  He is back to his normal activities and has no complaints today.   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 He is medically managed for hypertension. He is a current smoker. He is on a statin for hypercholesterolemia. He is on chronic anticoagulation. He has chronic renal insufficiency. His most recent creatinine was in the 2.3 range.   CURRENT MEDICATIONS:    Current Outpatient Medications  Medication Sig Dispense Refill  . amLODipine (NORVASC) 10 MG tablet Take 10 mg by mouth daily.    Marland Kitchen aspirin EC 81 MG tablet Take 81 mg by mouth daily.    Marland Kitchen b complex vitamins tablet Take 1 tablet by mouth daily.    . Calcium Carb-Cholecalciferol (CALCIUM 600+D) 600-800 MG-UNIT TABS Take 1 tablet by mouth daily.    . cholecalciferol (VITAMIN D3) 25 MCG (1000 UNIT) tablet Take 1,000 Units by mouth daily.    . Coenzyme Q-10 200 MG CAPS Take 200 mg by mouth daily.    . ferrous sulfate 325 (65 FE) MG tablet Take 325 mg by mouth daily.    . fluticasone (FLONASE) 50 MCG/ACT nasal spray Place 1 spray into both nostrils daily as needed for allergies or rhinitis.    Marland Kitchen levothyroxine (SYNTHROID) 75 MCG tablet Take 37.5 mcg by mouth at bedtime.    . metoprolol tartrate (LOPRESSOR) 50 MG tablet Take 50 mg by mouth 2 (two) times daily.    . Omega-3 Fatty Acids (FISH OIL) 1000 MG CAPS Take 1,000 mg by mouth daily.    Marland Kitchen omeprazole (PRILOSEC) 20 MG capsule Take 1 capsule (20 mg total) by mouth daily. 30 capsule 3  . patiromer (VELTASSA) 8.4 g packet  Take 1 packet (8.4 g total) by mouth daily.    . simvastatin (ZOCOR) 20 MG tablet Take 10 mg by mouth at bedtime.     . vitamin B-12 (CYANOCOBALAMIN) 500 MCG tablet Take 500 mcg by mouth daily.    Marland Kitchen oxyCODONE-acetaminophen (PERCOCET/ROXICET) 5-325 MG tablet Take 1 tablet by mouth every 8 (eight) hours as needed for moderate pain. (Patient not taking: Reported on 06/19/2019) 8 tablet 0   No current facility-administered medications for this visit.    REVIEW OF SYSTEMS:   [X]  denotes positive finding, [ ]  denotes negative finding Cardiac  Comments:  Chest pain or chest pressure:    Shortness of breath upon exertion:    Short of breath when lying flat:    Irregular heart rhythm:    Constitutional    Fever or chills:      PHYSICAL EXAM:   Vitals:   06/19/19 1102  BP: (!) 154/68  Pulse: (!) 53  Resp: 20  Temp: 98.1 F (36.7 C)  SpO2: 95%  Weight: 139 lb (63 kg)  Height: 5\' 6"  (1.676 m)    GENERAL: The patient is a well-nourished male, in no acute distress. The vital signs are documented above. CARDIOVASCULAR: There is a regular rate and rhythm. PULMONARY: Non-labored respirations Incisions have healed nicely  STUDIES:  CT scan: 1. Evidence of  interval stenting of the infrarenal abdominal aorta and bilateral common iliac arteries since the prior study. 2. Numerous large, stable left renal cysts. 3. Colonic diverticulosis.   MEDICAL ISSUES:   Status post endovascular aneurysm repair.  He has recovered nicely.  Noncontrast CT scan shows the device to be in good position.  He will follow-up in 6 months with an ultrasound.  Leia Alf, MD, FACS [facility] [phone] Pager [phone]

## 2019-06-20 ENCOUNTER — Other Ambulatory Visit: Payer: Self-pay | Admitting: *Deleted

## 2019-06-20 DIAGNOSIS — I714 Abdominal aortic aneurysm, without rupture, unspecified: Secondary | ICD-10-CM

## 2019-06-27 DIAGNOSIS — D631 Anemia in chronic kidney disease: Secondary | ICD-10-CM | POA: Diagnosis not present

## 2019-06-27 DIAGNOSIS — N189 Chronic kidney disease, unspecified: Secondary | ICD-10-CM | POA: Diagnosis not present

## 2019-08-30 DIAGNOSIS — H25813 Combined forms of age-related cataract, bilateral: Secondary | ICD-10-CM | POA: Diagnosis not present

## 2019-11-21 DIAGNOSIS — J449 Chronic obstructive pulmonary disease, unspecified: Secondary | ICD-10-CM | POA: Insufficient documentation

## 2019-11-21 DIAGNOSIS — E785 Hyperlipidemia, unspecified: Secondary | ICD-10-CM | POA: Insufficient documentation

## 2019-11-21 DIAGNOSIS — C61 Malignant neoplasm of prostate: Secondary | ICD-10-CM | POA: Insufficient documentation

## 2019-11-21 DIAGNOSIS — M1990 Unspecified osteoarthritis, unspecified site: Secondary | ICD-10-CM | POA: Insufficient documentation

## 2019-11-21 DIAGNOSIS — K644 Residual hemorrhoidal skin tags: Secondary | ICD-10-CM | POA: Insufficient documentation

## 2019-11-21 DIAGNOSIS — K219 Gastro-esophageal reflux disease without esophagitis: Secondary | ICD-10-CM | POA: Insufficient documentation

## 2019-11-21 DIAGNOSIS — J189 Pneumonia, unspecified organism: Secondary | ICD-10-CM | POA: Insufficient documentation

## 2019-11-21 DIAGNOSIS — I2510 Atherosclerotic heart disease of native coronary artery without angina pectoris: Secondary | ICD-10-CM | POA: Insufficient documentation

## 2019-11-21 DIAGNOSIS — I719 Aortic aneurysm of unspecified site, without rupture: Secondary | ICD-10-CM | POA: Insufficient documentation

## 2019-11-21 DIAGNOSIS — E7800 Pure hypercholesterolemia, unspecified: Secondary | ICD-10-CM | POA: Insufficient documentation

## 2019-11-21 DIAGNOSIS — I10 Essential (primary) hypertension: Secondary | ICD-10-CM | POA: Insufficient documentation

## 2019-11-21 DIAGNOSIS — R634 Abnormal weight loss: Secondary | ICD-10-CM | POA: Insufficient documentation

## 2019-11-21 DIAGNOSIS — H269 Unspecified cataract: Secondary | ICD-10-CM | POA: Insufficient documentation

## 2019-11-21 DIAGNOSIS — E559 Vitamin D deficiency, unspecified: Secondary | ICD-10-CM | POA: Insufficient documentation

## 2019-11-21 DIAGNOSIS — N289 Disorder of kidney and ureter, unspecified: Secondary | ICD-10-CM | POA: Insufficient documentation

## 2019-11-22 ENCOUNTER — Ambulatory Visit (INDEPENDENT_AMBULATORY_CARE_PROVIDER_SITE_OTHER): Payer: Medicare Other | Admitting: Cardiology

## 2019-11-22 ENCOUNTER — Other Ambulatory Visit: Payer: Self-pay

## 2019-11-22 ENCOUNTER — Encounter: Payer: Self-pay | Admitting: Cardiology

## 2019-11-22 VITALS — BP 146/76 | HR 42 | Ht 66.0 in | Wt 135.4 lb

## 2019-11-22 DIAGNOSIS — I10 Essential (primary) hypertension: Secondary | ICD-10-CM | POA: Diagnosis not present

## 2019-11-22 DIAGNOSIS — F17200 Nicotine dependence, unspecified, uncomplicated: Secondary | ICD-10-CM

## 2019-11-22 DIAGNOSIS — E782 Mixed hyperlipidemia: Secondary | ICD-10-CM | POA: Diagnosis not present

## 2019-11-22 DIAGNOSIS — N289 Disorder of kidney and ureter, unspecified: Secondary | ICD-10-CM

## 2019-11-22 DIAGNOSIS — I2510 Atherosclerotic heart disease of native coronary artery without angina pectoris: Secondary | ICD-10-CM | POA: Diagnosis not present

## 2019-11-22 NOTE — Patient Instructions (Signed)

## 2019-11-22 NOTE — Progress Notes (Signed)
Cardiology Office Note:    Date:  11/22/2019   ID:  Dustin Vaughan, DOB [birth year], MRN [id number]  PCP:  Dustin Johns, MD  Cardiologist:  Dustin Lindau, MD   Referring MD: Dustin Johns, MD    ASSESSMENT:    1. Coronary artery disease involving native coronary artery of native heart without angina pectoris   2. Essential hypertension   3. Mixed hyperlipidemia   4. Renal insufficiency   5. Smoker    PLAN:    In order of problems listed above:  1. Atherosclerotic vascular disease: Coronary artery disease: Secondary prevention stressed with the patient.  Importance of compliance with diet medication stressed and vocalized understanding.  Importance of regular exercise stressed and he was advised to walk at least half an hour a day 5 days a week.  He promises to do so. 2. Essential hypertension: Blood pressure stable and his blood pressure readings at home are fine. 3. Mixed dyslipidemia: Diet was emphasized he plans to go to his primary care physician early next month for blood work and will get a copy of this. 4. Renal insufficiency: Stable and monitored by primary care doctor. 5. Cigarette smoker: Advised to quit smoking and the risks were explained he has cut down but he does not seem keen to quit.  Risks explained.  He promises to do better. 6. Patient will be seen in follow-up appointment in 6 months or earlier if the patient has any concerns    Medication Adjustments/Labs and Tests Ordered: Current medicines are reviewed at length with the patient today.  Concerns regarding medicines are outlined above.  No orders of the defined types were placed in this encounter.  No orders of the defined types were placed in this encounter.    No chief complaint on file.    History of Present Illness:    Dustin Vaughan is a 86 y.o. male.  Patient has past medical history of atherosclerotic vascular disease, coronary artery disease, abdominal aortic aneurysm  repair essential hypertension and dyslipidemia.  He denies any problems at this time and takes care of activities of daily living.  No chest pain orthopnea or PND.  He walks on a regular basis.  Unfortunately continues to smoke.  At the time of my evaluation, the patient is alert awake oriented and in no distress.  Past Medical History:  Diagnosis Date  . AAA (abdominal aortic aneurysm) ([place]) 05/19/2019  . Abdominal aortic aneurysm (AAA) without rupture ([place]) 09/15/2016  . Acquired absence of kidney 10/20/2016   RIGHT KIDNEY--H/O CANCER 1997  . 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 Aneurysm, aortic ([place])   . Cancer of kidney ([place]) 1997   right removed   . Carcinoma of kidney ([place]) 10/22/2017   S/P NEPHRECTOMY- 1997 -LOST TO FOLLOW UP WITH UROLOGIST  . Cataract   . Chronic kidney disease 10/20/2016   Patient has history of S/P Nephrectomy Right Kidney and multiple cysts on left kidney( 15.4 cm in size as per DR Maryjean Morn note 01/2014).His creatinine is progressively rising,In past when patient was recomemnded surgical RX for the cyst at [facility]( 2013) ,he refused the option.I discussed with him about concerns of rising creatinine.We will request copy of U/S done at [facility] ( 01/2014)  and see if any progr  . COPD (chronic obstructive pulmonary disease) ([place])   . Coronary artery disease   . Coronary artery disease involving native coronary artery of native heart without angina pectoris 09/26/2014  . Essential  hypertension 09/26/2014  . GERD (gastroesophageal reflux disease)   . Hemorrhoids, external without complications   . High blood pressure   . HTN (hypertension)   . Hypercholesteremia   . Hyperlipidemia   . Kidney cysts 10/22/2017   [facility] at [facility]--revealed right kideny nephrectomy and cyst in left kidney-Surgical intervention recommended but poatient refused.;ast U/S 01/2014--revealed Left kidbney 15.4 cms with multiple cysts I have requested copy of last 2 U/S reports-If  progression in size of kidney or cysts patient was emphasised to reconsider SX evaluation  . Loss of weight 07/09/2018  . Mixed hyperlipidemia 09/26/2014  . Osteoarthritis   . Pneumonia   . Preoperative cardiovascular examination 05/15/2019  . Prostate cancer ([place])   . Renal insufficiency   . Smoker 09/15/2016  . Vitamin D deficiency   . Weight loss     Past Surgical History:  Procedure Laterality Date  . ABDOMINAL AORTIC ENDOVASCULAR STENT GRAFT N/A 05/19/2019   Procedure: ABDOMINAL AORTIC ENDOVASCULAR STENT GRAFT;  Surgeon: Serafina Mitchell, MD;  Location: [facility] OR;  Service: Vascular;  Laterality: N/A;  . ABDOMINAL AORTOGRAM W/LOWER EXTREMITY Bilateral 04/18/2019   Procedure: ABDOMINAL AORTOGRAM W/LOWER EXTREMITY;  Surgeon: Serafina Mitchell, MD;  Location: [facility] CV LAB;  Service: Cardiovascular;  Laterality: Bilateral;  . ANGIOPLASTY  02/26/2017  . COLON SURGERY    . COLONOSCOPY  12/09/2011   Colonic polyp status post polypectomy. [place] predominantly sigmoid diverticulosis. Mid radiationinduced chronic changes in the rectum. Otherwise normal colonoscopy. Tubular adenoma.   Marland Kitchen KIDNEY SURGERY Right 1997   only have 1 kidney. Left pt has   . ULTRASOUND GUIDANCE FOR VASCULAR ACCESS  05/19/2019   Procedure: Ultrasound Guidance For Vascular Access;  Surgeon: Serafina Mitchell, MD;  Location: [facility] OR;  Service: Vascular;;    Current Medications: Current Meds  Medication Sig  . amLODipine (NORVASC) 10 MG tablet Take 10 mg by mouth daily.  Marland Kitchen aspirin EC 81 MG tablet Take 81 mg by mouth daily.  . Coenzyme Q-10 200 MG CAPS Take 200 mg by mouth daily.  Marland Kitchen levothyroxine (SYNTHROID) 75 MCG tablet Take 37.5 mcg by mouth at bedtime.  . metoprolol tartrate (LOPRESSOR) 50 MG tablet Take 50 mg by mouth 2 (two) times daily.  . simvastatin (ZOCOR) 20 MG tablet Take 10 mg by mouth at bedtime.   . [DISCONTINUED] Omega-3 Fatty Acids (FISH OIL) 1000 MG CAPS Take 1,000 mg by mouth daily.     Allergies:    Patient has no known allergies.   Social History   Socioeconomic History  . Marital status: Married    Spouse name: Not on file  . Number of children: 3  . Years of education: Not on file  . Highest education level: Not on file  Occupational History  . Occupation: Retired  Tobacco Use  . Smoking status: Current Every Day Smoker    Packs/day: 0.25    Types: Cigarettes  . Smokeless tobacco: Never Used  Vaping Use  . Vaping Use: Never used  Substance and Sexual Activity  . Alcohol use: Not Currently    Comment: quit over a year ago  . Drug use: No  . Sexual activity: Not on file  Other Topics Concern  . Not on file  Social History Narrative  . Not on file   Social Determinants of Health   Financial Resource Strain:   . Difficulty of Paying Living Expenses: Not on file  Food Insecurity: No Food Insecurity  . Worried About Running  Out of Food in the Last Year: Never true  . Ran Out of Food in the Last Year: Never true  Transportation Needs: No Transportation Needs  . Lack of Transportation (Medical): No  . Lack of Transportation (Non-Medical): No  Physical Activity:   . Days of Exercise per Week: Not on file  . Minutes of Exercise per Session: Not on file  Stress:   . Feeling of Stress : Not on file  Social Connections:   . Frequency of Communication with Friends and Family: Not on file  . Frequency of Social Gatherings with Friends and Family: Not on file  . Attends Religious Services: Not on file  . Active Member of Clubs or Organizations: Not on file  . Attends Archivist Meetings: Not on file  . Marital Status: Not on file     Family History: The patient's family history includes Diabetes in his mother; Heart attack in his father; Heart disease in his father; Heart failure in his father; Hyperlipidemia in his father; Hypertension in his father; Stroke in his father. There is no history of Colon cancer.  ROS:   Please see the history of present  illness.    All other systems reviewed and are negative.  EKGs/Labs/Other Studies Reviewed:    The following studies were reviewed today: I discussed my findings with the patient at length.  Abdominal aortic surgery and its intervention was reviewed and the notes were reviewed.   Recent Labs: 05/17/2019: ALT 10 05/19/2019: Magnesium 1.6 05/20/2019: Hemoglobin 9.5; Platelets 128 05/21/2019: BUN 51; Creatinine, Ser 3.29; Potassium 5.6; Sodium 134  Recent Lipid Panel    Component Value Date/Time   CHOL 122 04/13/2018 1140   TRIG 111 04/13/2018 1140   HDL 48 04/13/2018 1140   CHOLHDL 2.5 04/13/2018 1140   LDLCALC 52 04/13/2018 1140    Physical Exam:    VS:  BP (!) 146/76   Pulse (!) 42   Ht 5\' 6"  (1.676 m)   Wt 135 lb 6.4 oz (61.4 kg)   SpO2 94%   BMI 21.85 kg/m     Wt Readings from Last 3 Encounters:  11/22/19 135 lb 6.4 oz (61.4 kg)  06/19/19 139 lb (63 kg)  05/19/19 154 lb 9.6 oz (70.1 kg)     GEN: Patient is in no acute distress HEENT: Normal NECK: No JVD; No carotid bruits LYMPHATICS: No lymphadenopathy CARDIAC: Hear sounds regular, 2/6 systolic murmur at the apex. RESPIRATORY:  Clear to auscultation without rales, wheezing or rhonchi  ABDOMEN: Soft, non-tender, non-distended MUSCULOSKELETAL:  No edema; No deformity  SKIN: Warm and dry NEUROLOGIC:  Alert and oriented x 3 PSYCHIATRIC:  Normal affect   Signed, Dustin Lindau, MD  11/22/2019 11:39 AM    [place]

## 2019-12-05 DIAGNOSIS — Z6823 Body mass index (BMI) 23.0-23.9, adult: Secondary | ICD-10-CM | POA: Diagnosis not present

## 2019-12-05 DIAGNOSIS — E785 Hyperlipidemia, unspecified: Secondary | ICD-10-CM | POA: Diagnosis not present

## 2019-12-05 DIAGNOSIS — J45909 Unspecified asthma, uncomplicated: Secondary | ICD-10-CM | POA: Diagnosis not present

## 2019-12-05 DIAGNOSIS — Z8679 Personal history of other diseases of the circulatory system: Secondary | ICD-10-CM | POA: Diagnosis not present

## 2019-12-05 DIAGNOSIS — D631 Anemia in chronic kidney disease: Secondary | ICD-10-CM | POA: Diagnosis not present

## 2019-12-05 DIAGNOSIS — I10 Essential (primary) hypertension: Secondary | ICD-10-CM | POA: Diagnosis not present

## 2019-12-05 DIAGNOSIS — Z79899 Other long term (current) drug therapy: Secondary | ICD-10-CM | POA: Diagnosis not present

## 2019-12-05 DIAGNOSIS — E559 Vitamin D deficiency, unspecified: Secondary | ICD-10-CM | POA: Diagnosis not present

## 2019-12-05 DIAGNOSIS — J309 Allergic rhinitis, unspecified: Secondary | ICD-10-CM | POA: Diagnosis not present

## 2019-12-05 DIAGNOSIS — E039 Hypothyroidism, unspecified: Secondary | ICD-10-CM | POA: Diagnosis not present

## 2019-12-05 DIAGNOSIS — N289 Disorder of kidney and ureter, unspecified: Secondary | ICD-10-CM | POA: Diagnosis not present

## 2019-12-05 DIAGNOSIS — N189 Chronic kidney disease, unspecified: Secondary | ICD-10-CM | POA: Diagnosis not present

## 2020-02-05 ENCOUNTER — Ambulatory Visit (HOSPITAL_COMMUNITY)
Admission: RE | Admit: 2020-02-05 | Discharge: 2020-02-05 | Disposition: A | Discharge status: Home / Self Care | Payer: Medicare Other | Source: Ambulatory Visit | Attending: Surgery | Admitting: Surgery

## 2020-02-05 ENCOUNTER — Other Ambulatory Visit: Payer: Self-pay | Admitting: *Deleted

## 2020-02-05 ENCOUNTER — Other Ambulatory Visit: Payer: Self-pay

## 2020-02-05 ENCOUNTER — Encounter: Payer: Self-pay | Admitting: Surgery

## 2020-02-05 ENCOUNTER — Ambulatory Visit (INDEPENDENT_AMBULATORY_CARE_PROVIDER_SITE_OTHER): Payer: Medicare Other | Admitting: Surgery

## 2020-02-05 VITALS — BP 146/69 | HR 53 | Temp 97.9°F | Resp 20 | Ht 66.0 in | Wt 130.0 lb

## 2020-02-05 DIAGNOSIS — I714 Abdominal aortic aneurysm, without rupture, unspecified: Secondary | ICD-10-CM

## 2020-02-05 NOTE — Progress Notes (Signed)
Vascular and Vein Specialist of [facility]  Patient name: OLLIN HOCHMUTH MRN: [id number] DOB: [birth year] Sex: male   REASON FOR VISIT:    Follow up  HISOTRY OF PRESENT ILLNESS:   Quinzell La Magdaleno Lortie is a 87 y.o. male who is status post endovascular repair of a 5.6 cm infrarenal abdominal aortic aneurysm using a iliac branch device.  He has chronic renal insufficiency and so a total of 22 cc of contrast was utilized.  He is back to his normal activities and has no complaints today.   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 He is medically managed for hypertension. He is a current smoker. He is on a statin for hypercholesterolemia. He is on chronic anticoagulation. He has chronic renal insufficiency. His most recent creatinine was in the 2.3 range.    PAST MEDICAL HISTORY:   Past Medical History:  Diagnosis Date  . AAA (abdominal aortic aneurysm) ([place]) 05/19/2019  . Abdominal aortic aneurysm (AAA) without rupture ([place]) 09/15/2016  . Acquired absence of kidney 10/20/2016   RIGHT KIDNEY--H/O CANCER 1997  . 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 Aneurysm, aortic ([place])   . Cancer of kidney ([place]) 1997   right removed   . Carcinoma of kidney ([place]) 10/22/2017   S/P NEPHRECTOMY- 1997 -LOST TO FOLLOW UP WITH UROLOGIST  . Cataract   . Chronic kidney disease 10/20/2016   Patient has history of S/P Nephrectomy Right Kidney and multiple cysts on left kidney( 15.4 cm in size as per DR Maryjean Morn note 01/2014).His creatinine is progressively rising,In past when patient was recomemnded surgical RX for the cyst at [facility]( 2013) ,he refused the option.I discussed with him about concerns of rising creatinine.We will request copy of U/S done at [facility] ( 01/2014)  and see if any progr  . COPD (chronic obstructive pulmonary disease) ([place])   . Coronary artery disease   . Coronary artery disease involving native coronary  artery of native heart without angina pectoris 09/26/2014  . Essential hypertension 09/26/2014  . GERD (gastroesophageal reflux disease)   . Hemorrhoids, external without complications   . High blood pressure   . HTN (hypertension)   . Hypercholesteremia   . Hyperlipidemia   . Kidney cysts 10/22/2017   U/S done at [facility] hospital-2013--revealed right kideny nephrectomy and cyst in left kidney-Surgical intervention recommended but poatient refused.;ast U/S 01/2014--revealed Left kidbney 15.4 cms with multiple cysts I have requested copy of last 2 U/S reports-If progression in size of kidney or cysts patient was emphasised to reconsider SX evaluation  . Loss of weight 07/09/2018  . Mixed hyperlipidemia 09/26/2014  . Osteoarthritis   . Pneumonia   . Preoperative cardiovascular examination 05/15/2019  . Prostate cancer ([place])   . Renal insufficiency   . Smoker 09/15/2016  . Vitamin D deficiency   . Weight loss      FAMILY HISTORY:   Family History  Problem Relation Age of Onset  . Diabetes Mother   . Heart disease Father   . Heart failure Father   . Hyperlipidemia Father   . Hypertension Father   . Heart attack Father   . Stroke Father   . Colon cancer Neg Hx     SOCIAL HISTORY:   Social History   Tobacco Use  . Smoking status: Current Every Day Smoker    Packs/day: 0.25    Types: Cigarettes  . Smokeless tobacco: Never Used  Substance Use Topics  . Alcohol  use: Not Currently    Comment: quit over a year ago     ALLERGIES:   No Known Allergies   CURRENT MEDICATIONS:   Current Outpatient Medications  Medication Sig Dispense Refill  . amLODipine (NORVASC) 10 MG tablet Take 10 mg by mouth daily.    Marland Kitchen aspirin EC 81 MG tablet Take 81 mg by mouth daily.    . Coenzyme Q-10 200 MG CAPS Take 200 mg by mouth daily.    Marland Kitchen levothyroxine (SYNTHROID) 75 MCG tablet Take 37.5 mcg by mouth at bedtime.    . metoprolol tartrate (LOPRESSOR) 50 MG tablet Take 50 mg by mouth 2 (two)  times daily.    . simvastatin (ZOCOR) 20 MG tablet Take 10 mg by mouth at bedtime.      No current facility-administered medications for this visit.    REVIEW OF SYSTEMS:   [X]  denotes positive finding, [ ]  denotes negative finding Cardiac  Comments:  Chest pain or chest pressure:    Shortness of breath upon exertion:    Short of breath when lying flat:    Irregular heart rhythm:        Vascular    Pain in calf, thigh, or hip brought on by ambulation:    Pain in feet at night that wakes you up from your sleep:     Blood clot in your veins:    Leg swelling:         Pulmonary    Oxygen at home:    Productive cough:     Wheezing:         Neurologic    Sudden weakness in arms or legs:     Sudden numbness in arms or legs:     Sudden onset of difficulty speaking or slurred speech:    Temporary loss of vision in one eye:     Problems with dizziness:         Gastrointestinal    Blood in stool:     Vomited blood:         Genitourinary    Burning when urinating:     Blood in urine:        Psychiatric    Major depression:         Hematologic    Bleeding problems:    Problems with blood clotting too easily:        Skin    Rashes or ulcers:        Constitutional    Fever or chills:      PHYSICAL EXAM:   Vitals:   02/05/20 0939  BP: (!) 146/69  Pulse: (!) 53  Resp: 20  Temp: 97.9 F (36.6 C)  SpO2: 96%  Weight: 130 lb (59 kg)  Height: 5\' 6"  (1.676 m)    GENERAL: The patient is a well-nourished male, in no acute distress. The vital signs are documented above. CARDIAC: There is a regular rate and rhythm.  PULMONARY: Non-labored respirations ABDOMEN: Soft and non-tender  MUSCULOSKELETAL: There are no major deformities or cyanosis. NEUROLOGIC: No focal weakness or paresthesias are detected. SKIN: There are no ulcers or rashes noted. PSYCHIATRIC: The patient has a normal affect.  STUDIES:   I have reviewed his ultrasound with the following  findings:  Abdominal Aorta: Patent endovascular aneurysm repair with evidence of  endoleak. Extra stent flow seen around the outflow limbs of the stent  graft (right more significant than left) consistent with type 1b endoleak.  Maximum aortic diameter is 5.9  cm MEDICAL ISSUES:   AAA: This is a somewhat difficult situation given the patient's renal insufficiency.  Today's ultrasound suggests a possible type Ib endoleak.  Maximum aortic diameter was 5.9 which is up from 5.61-year ago.  I have not convinced that this is a 1B endoleak given the anatomy of his stent graft repair.  Potentially this is a type II.  In addition the patient's wife has just been diagnosed with terminal cancer, had a 5 to 7 months left to live.  I have elected to get a noncontrasted CT scan to confirm the exact diameter of his aneurysm.  If there has been an increase in size, the neck step would be catheter-based angiography, likely with CO2 to help identify the endoleak.  I will get a CT scan within the next few weeks and have him follow-up.    Leia Alf, MD, FACS Vascular and Vein Specialists of [facility] [phone] Pager [phone]

## 2020-03-07 ENCOUNTER — Other Ambulatory Visit: Payer: Self-pay

## 2020-03-07 ENCOUNTER — Ambulatory Visit
Admission: RE | Admit: 2020-03-07 | Discharge: 2020-03-07 | Disposition: A | Discharge status: Home / Self Care | Payer: Medicare Other | Source: Ambulatory Visit | Attending: Surgery | Admitting: Surgery

## 2020-03-07 DIAGNOSIS — I714 Abdominal aortic aneurysm, without rupture, unspecified: Secondary | ICD-10-CM

## 2020-03-07 DIAGNOSIS — N281 Cyst of kidney, acquired: Secondary | ICD-10-CM | POA: Diagnosis not present

## 2020-03-07 DIAGNOSIS — K5730 Diverticulosis of large intestine without perforation or abscess without bleeding: Secondary | ICD-10-CM | POA: Diagnosis not present

## 2020-03-11 ENCOUNTER — Other Ambulatory Visit: Payer: Self-pay

## 2020-03-11 ENCOUNTER — Encounter: Payer: Self-pay | Admitting: Surgery

## 2020-03-11 ENCOUNTER — Ambulatory Visit (INDEPENDENT_AMBULATORY_CARE_PROVIDER_SITE_OTHER): Payer: Medicare Other | Admitting: Surgery

## 2020-03-11 VITALS — BP 168/82 | HR 51 | Temp 98.2°F | Resp 20 | Ht 66.0 in | Wt 131.9 lb

## 2020-03-11 DIAGNOSIS — I714 Abdominal aortic aneurysm, without rupture, unspecified: Secondary | ICD-10-CM

## 2020-03-11 NOTE — Progress Notes (Signed)
Patient name: Dustin Vaughan MRN: [id number] DOB: 05/12/1932 Sex: male  REASON FOR VISIT:     follow up  HISTORY OF PRESENT ILLNESS:    Dustin Vaughan a 87 y.o.malewho is status post endovascular repair of a 5.6 cm infrarenal abdominal aortic aneurysm using a iliac branch device. This was performed on 05/19/2019.  He has chronic renal insufficiency and so a total of 22 cc of contrast was utilized.  At his last visit there was concern over a type Ib endoleak.  I have recommended a CT scan to better evaluate this, since he cannot get contrast because of his renal disease.  Unfortunately, his wife has been diagnosed with terminal cancer.  He has no complaints of abdominal pain.   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 He is medically managed for hypertension. He is a current smoker. He is on a statin for hypercholesterolemia. He is on chronic anticoagulation. He has chronic renal insufficiency. His most recent creatinine was in the 2.3 range.  CURRENT MEDICATIONS:    Current Outpatient Medications  Medication Sig Dispense Refill  . amLODipine (NORVASC) 10 MG tablet Take 10 mg by mouth daily.    Marland Kitchen aspirin EC 81 MG tablet Take 81 mg by mouth daily.    . Coenzyme Q-10 200 MG CAPS Take 200 mg by mouth daily.    Marland Kitchen levothyroxine (SYNTHROID) 75 MCG tablet Take 37.5 mcg by mouth at bedtime.    . metoprolol tartrate (LOPRESSOR) 50 MG tablet Take 50 mg by mouth 2 (two) times daily.    . simvastatin (ZOCOR) 20 MG tablet Take 10 mg by mouth at bedtime.      No current facility-administered medications for this visit.    REVIEW OF SYSTEMS:   [X]  denotes positive finding, [ ]  denotes negative finding Cardiac  Comments:  Chest pain or chest pressure:    Shortness of breath upon exertion:    Short of breath when lying flat:    Irregular heart rhythm:    Constitutional    Fever or chills:       PHYSICAL EXAM:   Vitals:   03/11/20 1109  BP: (!) 168/82  Pulse: (!) 51  Resp: 20  Temp: 98.2 F (36.8 C)  SpO2: 92%  Weight: 131 lb 14.4 oz (59.8 kg)  Height: 5\' 6"  (1.676 m)    GENERAL: The patient is a well-nourished male, in no acute distress. The vital signs are documented above. CARDIOVASCULAR: There is a regular rate and rhythm. PULMONARY: Non-labored respirations   STUDIES:   I have reviewed the following: CT scan:  1. Status post stent graft repair of infrarenal abdominal aortic aneurysm. Aneurysmal sac measures 6.0 x 5.8 cm which is not significantly changed compared to prior exam. Endoleak cannot be excluded due to the lack of intravenous contrast. 2. Stable multiple large left renal cysts. 3. Sigmoid diverticulosis without inflammation. 4. Status post prostatic brachytherapy seed placement.  MEDICAL ISSUES:   AAA: I reviewed the CT scan with the patient.  Maximum diameter is 6 cm.  The presence of an endoleak cannot be determined due to lack of contrast.  The patient is taking care of his wife at home now.  The neck step would be CO2 angiography to better evaluate this, however I think the best option right now is to wait 6 months and repeat his ultrasound to see if there has been any change in his aneurysm.  If there  has, I will need to consider CO2 angiography.  Leia Alf, MD, FACS Vascular and Vein Specialists of [facility] [phone] Pager [phone]

## 2020-03-12 ENCOUNTER — Other Ambulatory Visit: Payer: Self-pay

## 2020-03-12 DIAGNOSIS — I714 Abdominal aortic aneurysm, without rupture, unspecified: Secondary | ICD-10-CM

## 2020-03-14 DIAGNOSIS — E785 Hyperlipidemia, unspecified: Secondary | ICD-10-CM | POA: Diagnosis not present

## 2020-03-14 DIAGNOSIS — Z79899 Other long term (current) drug therapy: Secondary | ICD-10-CM | POA: Diagnosis not present

## 2020-03-14 DIAGNOSIS — E875 Hyperkalemia: Secondary | ICD-10-CM | POA: Diagnosis not present

## 2020-03-14 DIAGNOSIS — N189 Chronic kidney disease, unspecified: Secondary | ICD-10-CM | POA: Diagnosis not present

## 2020-03-14 DIAGNOSIS — I10 Essential (primary) hypertension: Secondary | ICD-10-CM | POA: Diagnosis not present

## 2020-03-14 DIAGNOSIS — N289 Disorder of kidney and ureter, unspecified: Secondary | ICD-10-CM | POA: Diagnosis not present

## 2020-03-14 DIAGNOSIS — J309 Allergic rhinitis, unspecified: Secondary | ICD-10-CM | POA: Diagnosis not present

## 2020-03-14 DIAGNOSIS — Z8679 Personal history of other diseases of the circulatory system: Secondary | ICD-10-CM | POA: Diagnosis not present

## 2020-03-14 DIAGNOSIS — E039 Hypothyroidism, unspecified: Secondary | ICD-10-CM | POA: Diagnosis not present

## 2020-03-14 DIAGNOSIS — E559 Vitamin D deficiency, unspecified: Secondary | ICD-10-CM | POA: Diagnosis not present

## 2020-03-14 DIAGNOSIS — D631 Anemia in chronic kidney disease: Secondary | ICD-10-CM | POA: Diagnosis not present

## 2020-05-21 ENCOUNTER — Ambulatory Visit: Payer: Medicare Other | Admitting: Cardiology

## 2020-06-05 DIAGNOSIS — I719 Aortic aneurysm of unspecified site, without rupture: Secondary | ICD-10-CM | POA: Insufficient documentation

## 2020-06-05 DIAGNOSIS — I714 Abdominal aortic aneurysm, without rupture, unspecified: Secondary | ICD-10-CM | POA: Insufficient documentation

## 2020-06-05 HISTORY — DX: Aortic aneurysm of unspecified site, without rupture: I71.9

## 2020-06-05 HISTORY — DX: Abdominal aortic aneurysm, without rupture, unspecified: I71.40

## 2020-06-05 HISTORY — DX: Abdominal aortic aneurysm, without rupture: I71.4

## 2020-06-12 ENCOUNTER — Other Ambulatory Visit: Payer: Self-pay

## 2020-06-12 ENCOUNTER — Encounter: Payer: Self-pay | Admitting: Cardiology

## 2020-06-12 ENCOUNTER — Ambulatory Visit (INDEPENDENT_AMBULATORY_CARE_PROVIDER_SITE_OTHER): Payer: Medicare Other | Admitting: Cardiology

## 2020-06-12 VITALS — BP 134/62 | HR 57 | Ht 66.0 in | Wt 129.2 lb

## 2020-06-12 DIAGNOSIS — I2510 Atherosclerotic heart disease of native coronary artery without angina pectoris: Secondary | ICD-10-CM | POA: Diagnosis not present

## 2020-06-12 DIAGNOSIS — I714 Abdominal aortic aneurysm, without rupture, unspecified: Secondary | ICD-10-CM

## 2020-06-12 DIAGNOSIS — E782 Mixed hyperlipidemia: Secondary | ICD-10-CM | POA: Diagnosis not present

## 2020-06-12 DIAGNOSIS — I10 Essential (primary) hypertension: Secondary | ICD-10-CM | POA: Diagnosis not present

## 2020-06-12 MED ORDER — METOPROLOL TARTRATE 25 MG PO TABS: 25.0000 mg | ORAL_TABLET | Freq: Every morning | ORAL | 3 refills | Status: AC

## 2020-06-12 NOTE — Patient Instructions (Signed)

## 2020-06-12 NOTE — Progress Notes (Signed)
Cardiology Office Note:    Date:  06/12/2020   ID:  Dustin Vaughan, DOB 02/02/1932, MRN [id number]  PCP:  Nicholos Johns, MD  Cardiologist:  Jenean Lindau, MD   Referring MD: Nicholos Johns, MD    ASSESSMENT:    1. Coronary artery disease involving native coronary artery of native heart without angina pectoris   2. Essential hypertension   3. Mixed hyperlipidemia   4. Abdominal aortic aneurysm (AAA) without rupture (HCC)    PLAN:    In order of problems listed above:  1. Coronary artery disease: Secondary prevention stressed to the patient.  Importance of compliance with diet medication stressed any vocalized understanding.  He was advised to walk on a regular basis to the best of his ability. 2. Abdominal aortic aneurysm post stenting: Patient mentions to me that his stenting procedure went well and is happy about it. 3. Essential hypertension: Blood pressure stable and diet was emphasized.  Because of his fatigue issues I told him to take metoprolol half tablet which is 25 mg only in the morning.  If he still feels fatigued I told him to try getting off the medication and let us know how he feels.  He will keep a track of his blood pressures and heart rates also. 4. Mixed dyslipidemia: Lipids followed by primary care doctor.  Diet emphasized. 5. Advanced renal insufficiency: Stable at this time.  Followed by nephrology. 6. Patient will be seen in follow-up appointment in 6 months or earlier if the patient has any concerns    Medication Adjustments/Labs and Tests Ordered: Current medicines are reviewed at length with the patient today.  Concerns regarding medicines are outlined above.  No orders of the defined types were placed in this encounter.  No orders of the defined types were placed in this encounter.    No chief complaint on file.    History of Present Illness:    Dustin Vaughan is a 87 y.o. male.  Patient has past medical history of coronary artery disease,  essential hypertension, dyslipidemia and advanced renal insufficiency.  He denies any problems at this time and takes care of activities of daily living.  No chest pain orthopnea or PND.  He tells me that he feels fatigued at times and his heart rate is in the low 40s and wants to cut back on his beta-blocker.  No chest pain orthopnea or PND.  At the time of my evaluation, the patient is alert awake oriented and in no distress.  Past Medical History:  Diagnosis Date  . AAA (abdominal aortic aneurysm) ([place]) 05/19/2019  . Abdominal aneurysm ([place]) 06/05/2020  . Abdominal aortic aneurysm (AAA) without rupture ([place]) 09/15/2016  . Acquired absence of kidney 10/20/2016   RIGHT KIDNEY--H/O CANCER 1997  . Aneurysm of aorta ([place]) 06/05/2020  . Aneurysm of iliac artery ([place]) 09/15/2016  . Aneurysm, aortic ([place])   . Cancer of kidney ([place]) 1997   right removed   . Carcinoma of kidney ([place]) 10/22/2017   S/P NEPHRECTOMY- 1997 -LOST TO FOLLOW UP WITH UROLOGIST  . Cataract   . Chronic kidney disease 10/20/2016   Patient has history of S/P Nephrectomy Right Kidney and multiple cysts on left kidney( 15.4 cm in size as per DR Maryjean Morn note 01/2014).His creatinine is progressively rising,In past when patient was recomemnded surgical RX for the cyst at [facility]( 2013) ,he refused the option.I discussed with him about concerns of rising creatinine.We will request copy of U/S done at [facility] (  01/2014)  and see if any progr  . COPD (chronic obstructive pulmonary disease) ([place])   . Coronary artery disease   . Coronary artery disease involving native coronary artery of native heart without angina pectoris 09/26/2014  . Coronary atherosclerosis of native coronary artery 01/26/1998   Apr 17, 2004 Entered By: Gerilyn Nestle Comment: MI followed by thrombolytics and stent  . Essential hypertension 09/26/2014  . GERD (gastroesophageal reflux disease)   . Hemorrhoids, external without complications   . High blood pressure   . HTN  (hypertension)   . Hypercholesteremia   . Hyperlipidemia   . Kidney cysts 10/22/2017   U/S done at [facility] hospital-2013--revealed right kideny nephrectomy and cyst in left kidney-Surgical intervention recommended but poatient refused.;ast U/S 01/2014--revealed Left kidbney 15.4 cms with multiple cysts I have requested copy of last 2 U/S reports-If progression in size of kidney or cysts patient was emphasised to reconsider SX evaluation  . Loss of weight 07/09/2018  . Malignant neoplasm of kidney excluding renal pelvis ([place]) 01/27/1995   Apr 17, 2004 Entered By: Gerilyn Nestle Comment: nephrectomy on right  . Mixed hyperlipidemia 09/26/2014  . Osteoarthritis   . Pneumonia   . Preoperative cardiovascular examination 05/15/2019  . Prostate cancer ([place])   . Renal insufficiency   . Smoker 09/15/2016  . Vitamin D deficiency   . Weight loss     Past Surgical History:  Procedure Laterality Date  . ABDOMINAL AORTIC ENDOVASCULAR STENT GRAFT N/A 05/19/2019   Procedure: ABDOMINAL AORTIC ENDOVASCULAR STENT GRAFT;  Surgeon: Serafina Mitchell, MD;  Location: [facility] OR;  Service: Vascular;  Laterality: N/A;  . ABDOMINAL AORTOGRAM W/LOWER EXTREMITY Bilateral 04/18/2019   Procedure: ABDOMINAL AORTOGRAM W/LOWER EXTREMITY;  Surgeon: Serafina Mitchell, MD;  Location: [place] [facility] LAB;  Service: Cardiovascular;  Laterality: Bilateral;  . ANGIOPLASTY  02/26/2017  . COLON SURGERY    . COLONOSCOPY  12/09/2011   Colonic polyp status post polypectomy. [place] predominantly sigmoid diverticulosis. Mid radiationinduced chronic changes in the rectum. Otherwise normal colonoscopy. Tubular adenoma.   Marland Kitchen KIDNEY SURGERY Right 1997   only have 1 kidney. Left pt has   . ULTRASOUND GUIDANCE FOR VASCULAR ACCESS  05/19/2019   Procedure: Ultrasound Guidance For Vascular Access;  Surgeon: Serafina Mitchell, MD;  Location: [facility] OR;  Service: Vascular;;    Current Medications: No outpatient medications have been marked as taking for  the 06/12/20 encounter (Office Visit) with Laruth Hanger, Reita Cliche, MD.     Allergies:   Patient has no known allergies.   Social History   Socioeconomic History  . Marital status: Married    Spouse name: Not on file  . Number of children: 3  . Years of education: Not on file  . Highest education level: Not on file  Occupational History  . Occupation: Retired  Tobacco Use  . Smoking status: Current Every Day Smoker    Packs/day: 0.25    Types: Cigarettes  . Smokeless tobacco: Never Used  Vaping Use  . Vaping Use: Never used  Substance and Sexual Activity  . Alcohol use: Not Currently    Comment: quit over a year ago  . Drug use: No  . Sexual activity: Not on file  Other Topics Concern  . Not on file  Social History Narrative  . Not on file   Social Determinants of Health   Financial Resource Strain: Not on file  Food Insecurity: Not on file  Transportation Needs: Not on file  Physical Activity: Not on  file  Stress: Not on file  Social Connections: Not on file     Family History: The patient's family history includes Diabetes in his mother; Heart attack in his father; Heart disease in his father; Heart failure in his father; Hyperlipidemia in his father; Hypertension in his father; Stroke in his father. There is no history of Colon cancer.  ROS:   Please see the history of present illness.    All other systems reviewed and are negative.  EKGs/Labs/Other Studies Reviewed:    The following studies were reviewed today: EKG reveals sinus rhythm, right bundle branch block and left anterior fascicular block.  Patient has first-degree AV block.   Recent Labs: No results found for requested labs within last 8760 hours.  Recent Lipid Panel    Component Value Date/Time   CHOL 122 04/13/2018 1140   TRIG 111 04/13/2018 1140   HDL 48 04/13/2018 1140   CHOLHDL 2.5 04/13/2018 1140   LDLCALC 52 04/13/2018 1140    Physical Exam:    VS:  There were no vitals taken for this  visit.    Wt Readings from Last 3 Encounters:  03/11/20 131 lb 14.4 oz (59.8 kg)  02/05/20 130 lb (59 kg)  11/22/19 135 lb 6.4 oz (61.4 kg)     GEN: Patient is in no acute distress HEENT: Normal NECK: No JVD; No carotid bruits LYMPHATICS: No lymphadenopathy CARDIAC: Hear sounds regular, 2/6 systolic murmur at the apex. RESPIRATORY:  Clear to auscultation without rales, wheezing or rhonchi  ABDOMEN: Soft, non-tender, non-distended MUSCULOSKELETAL:  No edema; No deformity  SKIN: Warm and dry NEUROLOGIC:  Alert and oriented x 3 PSYCHIATRIC:  Normal affect   Signed, Jenean Lindau, MD  06/12/2020 1:55 PM    [facility]

## 2020-07-23 DIAGNOSIS — Z9181 History of falling: Secondary | ICD-10-CM | POA: Diagnosis not present

## 2020-07-23 DIAGNOSIS — E785 Hyperlipidemia, unspecified: Secondary | ICD-10-CM | POA: Diagnosis not present

## 2020-07-23 DIAGNOSIS — Z0000 Encounter for general adult medical examination without abnormal findings: Secondary | ICD-10-CM | POA: Diagnosis not present

## 2020-07-23 DIAGNOSIS — Z1331 Encounter for screening for depression: Secondary | ICD-10-CM | POA: Diagnosis not present

## 2020-09-02 DIAGNOSIS — H25813 Combined forms of age-related cataract, bilateral: Secondary | ICD-10-CM | POA: Diagnosis not present

## 2020-09-09 ENCOUNTER — Ambulatory Visit (INDEPENDENT_AMBULATORY_CARE_PROVIDER_SITE_OTHER): Payer: Medicare Other | Admitting: Surgery

## 2020-09-09 ENCOUNTER — Ambulatory Visit (HOSPITAL_COMMUNITY)
Admission: RE | Admit: 2020-09-09 | Discharge: 2020-09-09 | Disposition: A | Discharge status: Home / Self Care | Payer: Medicare Other | Source: Ambulatory Visit | Attending: Surgery | Admitting: Surgery

## 2020-09-09 ENCOUNTER — Encounter: Payer: Self-pay | Admitting: Surgery

## 2020-09-09 ENCOUNTER — Other Ambulatory Visit: Payer: Self-pay

## 2020-09-09 VITALS — BP 172/75 | HR 54 | Resp 20 | Ht 66.0 in | Wt 126.0 lb

## 2020-09-09 DIAGNOSIS — I714 Abdominal aortic aneurysm, without rupture, unspecified: Secondary | ICD-10-CM

## 2020-09-09 DIAGNOSIS — I2510 Atherosclerotic heart disease of native coronary artery without angina pectoris: Secondary | ICD-10-CM

## 2020-09-09 NOTE — Progress Notes (Signed)
V[facility] and [facility]in Specialist of [facility]  Patient name: Dustin Vaughan MRN: [id number] DOB: [birth year] Sex: male   REASON FOR VISIT:    Follow up  HISOTRY OF PRESENT ILLNESS:    Dustin Vaughan is a 87 y.o. male who is status post endovascular repair of a 5.6 cm infrarenal abdominal aortic aneurysm using a iliac branch device.  This was performed on 05/19/2019.  He has chronic renal insufficiency and so a total of 22 cc of contrast was utilized.  At his last visit there was concern over a type Ib endoleak. I had recommended a CO2 angiogram to evaluate this due to his renal disease, however his wife was diagnosed with terminal cancer, and so we decided to delay this.  He is not having any abdominal pain or back pain today.  He is concerned about his weight loss.  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  He is medically managed for hypertension.  He is a current smoker.  He is on a statin for hypercholesterolemia.  He is on chronic anticoagulation.  He has chronic renal insufficiency.  His most recent creatinine was in the 2.3 range. 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   Aneurysm, aortic ([place])    Cancer of kidney ([place]) 1997   right removed    Carcinoma of kidney ([place]) 10/22/2017   S/P NEPHRECTOMY- 1997 -LOST TO FOLLOW UP WITH UROLOGIST   Cataract    Chronic kidney disease 10/20/2016   Patient has history of S/P Nephrectomy Right Kidney and multiple cysts on left kidney( 15.4 cm in size as per DR Maryjean Morn note 01/2014).His creatinine is progressively rising,In past when patient  was recomemnded surgical RX for the cyst at [facility]( 2013) ,he refused the option.I discussed with him about concerns of rising creatinine.We will request copy of U/S done at [facility] ( 01/2014)  and see if any progr   COPD (chronic obstructive pulmonary disease) ([place])    Coronary artery disease    Coronary artery disease involving native coronary artery of native heart without angina pectoris 09/26/2014   Coronary atherosclerosis of native coronary artery 01/26/1998   Apr 17, 2004 Entered By: Dennie Maizes F Comment: MI followed by thrombolytics and stent   Essential hypertension 09/26/2014   GERD (gastroesophageal reflux disease)    Hemorrhoids, external without complications    High blood pressure    HTN (hypertension)    Hypercholesteremia    Hyperlipidemia    Kidney cysts 10/22/2017   U/S done at [facility] hospital-2013--revealed right kideny nephrectomy and cyst in left kidney-Surgical intervention recommended but poatient refused.;ast U/S 01/2014--revealed Left kidbney 15.4 cms with multiple cysts I have requested copy of last 2 U/S reports-If progression in size of kidney or cysts patient was emphasised to reconsider SX evaluation   Loss of weight 07/09/2018   Malignant neoplasm of kidney excluding renal pelvis ([place]) 01/27/1995   Apr 17, 2004 Entered By: Gerilyn Nestle Comment: nephrectomy on right   Mixed hyperlipidemia 09/26/2014   Osteoarthritis    Pneumonia    Preoperative cardiovascular examination 05/15/2019   Prostate cancer [facility])    Renal insufficiency  Smoker 09/15/2016   Vitamin D deficiency    Weight loss      FAMILY HISTORY:   Family History  Problem Relation Age of Onset   Diabetes Mother    Heart disease Father    Heart failure Father    Hyperlipidemia Father    Hypertension Father    Heart attack Father    Stroke Father    Colon cancer Neg Hx     SOCIAL HISTORY:   Social History   Tobacco Use   Smoking status: Every Day    Packs/day: 0.25    Types:  Cigarettes   Smokeless tobacco: Never  Substance Use Topics   Alcohol use: Not Currently    Comment: quit over a year ago     ALLERGIES:   No Known Allergies   CURRENT MEDICATIONS:   Current Outpatient Medications  Medication Sig Dispense Refill   amLODipine (NORVASC) 10 MG tablet Take 10 mg by mouth daily.     aspirin EC 81 MG tablet Take 81 mg by mouth daily.     Coenzyme Q-10 200 MG CAPS Take 200 mg by mouth daily.     levothyroxine (SYNTHROID) 75 MCG tablet Take 37.5 mcg by mouth at bedtime.     metoprolol tartrate (LOPRESSOR) 25 MG tablet Take 1 tablet (25 mg total) by mouth in the morning. 90 tablet 3   simvastatin (ZOCOR) 20 MG tablet Take 10 mg by mouth at bedtime.      No current facility-administered medications for this visit.    REVIEW OF SYSTEMS:   [X]  denotes positive finding, [ ]  denotes negative finding Cardiac  Comments:  Chest pain or chest pressure:    Shortness of breath upon exertion:    Short of breath when lying flat:    Irregular heart rhythm:        Vascular    Pain in calf, thigh, or hip brought on by ambulation:    Pain in feet at night that wakes you up from your sleep:     Blood clot in your veins:    Leg swelling:         Pulmonary    Oxygen at home:    Productive cough:     Wheezing:         Neurologic    Sudden weakness in arms or legs:     Sudden numbness in arms or legs:     Sudden onset of difficulty speaking or slurred speech:    Temporary loss of vision in one eye:     Problems with dizziness:         Gastrointestinal    Blood in stool:     Vomited blood:         Genitourinary    Burning when urinating:     Blood in urine:        Psychiatric    Major depression:         Hematologic    Bleeding problems:    Problems with blood clotting too easily:        Skin    Rashes or ulcers:        Constitutional    Fever or chills:      PHYSICAL EXAM:   There were no vitals filed for this visit.  GENERAL: The  patient is a well-nourished male, in no acute distress. The vital signs are documented above. CARDIAC: There is a regular rate and rhythm.  VASCULAR: Palpable pedal pulses PULMONARY: Non-labored  respirations ABDOMEN: Soft and non-tender .  MUSCULOSKELETAL: There are no major deformities or cyanosis. NEUROLOGIC: No focal weakness or paresthesias are detected. SKIN: There are no ulcers or rashes noted. PSYCHIATRIC: The patient has a normal affect.  STUDIES:   I have reviewed his u/s with the following findings:  Abdominal Aorta: Patent endovascular aneurysm repair with evidence of  endoleak. The largest aortic diameter remains essentially unchanged  compared to prior exam. Previous diameter measurement was 6.0 cm obtained  on 02/05/2020.  MEDICAL ISSUES:   AAA: We got better images today and the leak looks more like a type II leak.  His maximum diameter remains unchanged at 5.9 cm.  I discussed that we would continue to monitor this with an ultrasound in 6 months.    Leia Alf, MD, FACS [facility] [phone] Pager [phone]

## 2020-09-11 ENCOUNTER — Other Ambulatory Visit: Payer: Self-pay

## 2020-09-11 DIAGNOSIS — I714 Abdominal aortic aneurysm, without rupture, unspecified: Secondary | ICD-10-CM

## 2020-10-14 DIAGNOSIS — R0602 Shortness of breath: Secondary | ICD-10-CM | POA: Diagnosis not present

## 2020-10-14 DIAGNOSIS — Z8546 Personal history of malignant neoplasm of prostate: Secondary | ICD-10-CM | POA: Diagnosis not present

## 2020-10-14 DIAGNOSIS — Z9981 Dependence on supplemental oxygen: Secondary | ICD-10-CM | POA: Diagnosis not present

## 2020-10-14 DIAGNOSIS — Z79899 Other long term (current) drug therapy: Secondary | ICD-10-CM | POA: Diagnosis not present

## 2020-10-14 DIAGNOSIS — I120 Hypertensive chronic kidney disease with stage 5 chronic kidney disease or end stage renal disease: Secondary | ICD-10-CM | POA: Diagnosis present

## 2020-10-14 DIAGNOSIS — Z905 Acquired absence of kidney: Secondary | ICD-10-CM | POA: Diagnosis not present

## 2020-10-14 DIAGNOSIS — A4189 Other specified sepsis: Secondary | ICD-10-CM | POA: Diagnosis not present

## 2020-10-14 DIAGNOSIS — E875 Hyperkalemia: Secondary | ICD-10-CM | POA: Diagnosis not present

## 2020-10-14 DIAGNOSIS — N179 Acute kidney failure, unspecified: Secondary | ICD-10-CM | POA: Diagnosis not present

## 2020-10-14 DIAGNOSIS — J9601 Acute respiratory failure with hypoxia: Secondary | ICD-10-CM | POA: Diagnosis not present

## 2020-10-14 DIAGNOSIS — Z8711 Personal history of peptic ulcer disease: Secondary | ICD-10-CM | POA: Diagnosis not present

## 2020-10-14 DIAGNOSIS — R0902 Hypoxemia: Secondary | ICD-10-CM | POA: Diagnosis not present

## 2020-10-14 DIAGNOSIS — J1282 Pneumonia due to coronavirus disease 2019: Secondary | ICD-10-CM | POA: Diagnosis not present

## 2020-10-14 DIAGNOSIS — J440 Chronic obstructive pulmonary disease with acute lower respiratory infection: Secondary | ICD-10-CM | POA: Diagnosis present

## 2020-10-14 DIAGNOSIS — J189 Pneumonia, unspecified organism: Secondary | ICD-10-CM | POA: Diagnosis not present

## 2020-10-14 DIAGNOSIS — J159 Unspecified bacterial pneumonia: Secondary | ICD-10-CM | POA: Diagnosis not present

## 2020-10-14 DIAGNOSIS — D631 Anemia in chronic kidney disease: Secondary | ICD-10-CM | POA: Diagnosis present

## 2020-10-14 DIAGNOSIS — Z85528 Personal history of other malignant neoplasm of kidney: Secondary | ICD-10-CM | POA: Diagnosis not present

## 2020-10-14 DIAGNOSIS — F17210 Nicotine dependence, cigarettes, uncomplicated: Secondary | ICD-10-CM | POA: Diagnosis present

## 2020-10-14 DIAGNOSIS — E039 Hypothyroidism, unspecified: Secondary | ICD-10-CM | POA: Diagnosis present

## 2020-10-14 DIAGNOSIS — Z7982 Long term (current) use of aspirin: Secondary | ICD-10-CM | POA: Diagnosis not present

## 2020-10-14 DIAGNOSIS — R197 Diarrhea, unspecified: Secondary | ICD-10-CM | POA: Diagnosis not present

## 2020-10-14 DIAGNOSIS — U071 COVID-19: Secondary | ICD-10-CM | POA: Diagnosis not present

## 2020-10-14 DIAGNOSIS — I2510 Atherosclerotic heart disease of native coronary artery without angina pectoris: Secondary | ICD-10-CM | POA: Diagnosis present

## 2020-10-14 DIAGNOSIS — N185 Chronic kidney disease, stage 5: Secondary | ICD-10-CM | POA: Diagnosis present

## 2020-10-14 DIAGNOSIS — E785 Hyperlipidemia, unspecified: Secondary | ICD-10-CM | POA: Diagnosis present

## 2020-10-14 DIAGNOSIS — R6520 Severe sepsis without septic shock: Secondary | ICD-10-CM | POA: Diagnosis present

## 2020-10-20 DIAGNOSIS — J440 Chronic obstructive pulmonary disease with acute lower respiratory infection: Secondary | ICD-10-CM | POA: Diagnosis not present

## 2020-10-20 DIAGNOSIS — I120 Hypertensive chronic kidney disease with stage 5 chronic kidney disease or end stage renal disease: Secondary | ICD-10-CM | POA: Diagnosis not present

## 2020-10-20 DIAGNOSIS — J189 Pneumonia, unspecified organism: Secondary | ICD-10-CM | POA: Diagnosis not present

## 2020-10-20 DIAGNOSIS — R0902 Hypoxemia: Secondary | ICD-10-CM | POA: Diagnosis not present

## 2020-10-20 DIAGNOSIS — J9601 Acute respiratory failure with hypoxia: Secondary | ICD-10-CM | POA: Diagnosis not present

## 2020-10-20 DIAGNOSIS — E872 Acidosis: Secondary | ICD-10-CM | POA: Diagnosis not present

## 2020-10-20 DIAGNOSIS — U071 COVID-19: Secondary | ICD-10-CM | POA: Diagnosis not present

## 2020-10-20 DIAGNOSIS — A419 Sepsis, unspecified organism: Secondary | ICD-10-CM | POA: Diagnosis not present

## 2020-10-20 DIAGNOSIS — Z20822 Contact with and (suspected) exposure to covid-19: Secondary | ICD-10-CM | POA: Diagnosis not present

## 2020-10-20 DIAGNOSIS — J90 Pleural effusion, not elsewhere classified: Secondary | ICD-10-CM | POA: Diagnosis not present

## 2020-10-20 DIAGNOSIS — I2510 Atherosclerotic heart disease of native coronary artery without angina pectoris: Secondary | ICD-10-CM | POA: Diagnosis not present

## 2020-10-20 DIAGNOSIS — N185 Chronic kidney disease, stage 5: Secondary | ICD-10-CM | POA: Diagnosis not present

## 2020-10-20 DIAGNOSIS — J1282 Pneumonia due to coronavirus disease 2019: Secondary | ICD-10-CM | POA: Diagnosis not present

## 2020-10-20 DIAGNOSIS — R6520 Severe sepsis without septic shock: Secondary | ICD-10-CM | POA: Diagnosis not present

## 2020-10-20 DIAGNOSIS — I129 Hypertensive chronic kidney disease with stage 1 through stage 4 chronic kidney disease, or unspecified chronic kidney disease: Secondary | ICD-10-CM | POA: Diagnosis not present

## 2020-10-20 DIAGNOSIS — I700 Atherosclerosis of aorta: Secondary | ICD-10-CM | POA: Diagnosis not present

## 2020-10-20 DIAGNOSIS — R0602 Shortness of breath: Secondary | ICD-10-CM | POA: Diagnosis not present

## 2020-10-20 DIAGNOSIS — C649 Malignant neoplasm of unspecified kidney, except renal pelvis: Secondary | ICD-10-CM | POA: Diagnosis not present

## 2020-10-20 DIAGNOSIS — N189 Chronic kidney disease, unspecified: Secondary | ICD-10-CM | POA: Diagnosis not present

## 2020-10-21 DIAGNOSIS — I714 Abdominal aortic aneurysm, without rupture, unspecified: Secondary | ICD-10-CM | POA: Diagnosis present

## 2020-10-21 DIAGNOSIS — E039 Hypothyroidism, unspecified: Secondary | ICD-10-CM | POA: Diagnosis present

## 2020-10-21 DIAGNOSIS — J9811 Atelectasis: Secondary | ICD-10-CM | POA: Diagnosis not present

## 2020-10-21 DIAGNOSIS — Z792 Long term (current) use of antibiotics: Secondary | ICD-10-CM | POA: Diagnosis not present

## 2020-10-21 DIAGNOSIS — Z66 Do not resuscitate: Secondary | ICD-10-CM | POA: Diagnosis not present

## 2020-10-21 DIAGNOSIS — E872 Acidosis, unspecified: Secondary | ICD-10-CM | POA: Diagnosis not present

## 2020-10-21 DIAGNOSIS — Z743 Need for continuous supervision: Secondary | ICD-10-CM | POA: Diagnosis not present

## 2020-10-21 DIAGNOSIS — I6782 Cerebral ischemia: Secondary | ICD-10-CM | POA: Diagnosis not present

## 2020-10-21 DIAGNOSIS — I2510 Atherosclerotic heart disease of native coronary artery without angina pectoris: Secondary | ICD-10-CM | POA: Diagnosis present

## 2020-10-21 DIAGNOSIS — Z905 Acquired absence of kidney: Secondary | ICD-10-CM | POA: Diagnosis not present

## 2020-10-21 DIAGNOSIS — D631 Anemia in chronic kidney disease: Secondary | ICD-10-CM | POA: Diagnosis not present

## 2020-10-21 DIAGNOSIS — E7800 Pure hypercholesterolemia, unspecified: Secondary | ICD-10-CM | POA: Diagnosis not present

## 2020-10-21 DIAGNOSIS — J189 Pneumonia, unspecified organism: Secondary | ICD-10-CM | POA: Diagnosis not present

## 2020-10-21 DIAGNOSIS — J1282 Pneumonia due to coronavirus disease 2019: Secondary | ICD-10-CM | POA: Diagnosis present

## 2020-10-21 DIAGNOSIS — N184 Chronic kidney disease, stage 4 (severe): Secondary | ICD-10-CM | POA: Diagnosis not present

## 2020-10-21 DIAGNOSIS — E782 Mixed hyperlipidemia: Secondary | ICD-10-CM | POA: Diagnosis present

## 2020-10-21 DIAGNOSIS — I252 Old myocardial infarction: Secondary | ICD-10-CM | POA: Diagnosis not present

## 2020-10-21 DIAGNOSIS — J188 Other pneumonia, unspecified organism: Secondary | ICD-10-CM | POA: Diagnosis not present

## 2020-10-21 DIAGNOSIS — U071 COVID-19: Secondary | ICD-10-CM | POA: Diagnosis present

## 2020-10-21 DIAGNOSIS — Z6820 Body mass index (BMI) 20.0-20.9, adult: Secondary | ICD-10-CM | POA: Diagnosis not present

## 2020-10-21 DIAGNOSIS — R410 Disorientation, unspecified: Secondary | ICD-10-CM | POA: Diagnosis not present

## 2020-10-21 DIAGNOSIS — Z8679 Personal history of other diseases of the circulatory system: Secondary | ICD-10-CM | POA: Diagnosis not present

## 2020-10-21 DIAGNOSIS — I4510 Unspecified right bundle-branch block: Secondary | ICD-10-CM | POA: Diagnosis not present

## 2020-10-21 DIAGNOSIS — A419 Sepsis, unspecified organism: Secondary | ICD-10-CM | POA: Diagnosis present

## 2020-10-21 DIAGNOSIS — R0600 Dyspnea, unspecified: Secondary | ICD-10-CM | POA: Diagnosis not present

## 2020-10-21 DIAGNOSIS — J984 Other disorders of lung: Secondary | ICD-10-CM | POA: Diagnosis not present

## 2020-10-21 DIAGNOSIS — J90 Pleural effusion, not elsewhere classified: Secondary | ICD-10-CM | POA: Diagnosis present

## 2020-10-21 DIAGNOSIS — J9601 Acute respiratory failure with hypoxia: Secondary | ICD-10-CM | POA: Diagnosis present

## 2020-10-21 DIAGNOSIS — R4182 Altered mental status, unspecified: Secondary | ICD-10-CM | POA: Diagnosis not present

## 2020-10-21 DIAGNOSIS — N185 Chronic kidney disease, stage 5: Secondary | ICD-10-CM | POA: Diagnosis present

## 2020-10-21 DIAGNOSIS — I129 Hypertensive chronic kidney disease with stage 1 through stage 4 chronic kidney disease, or unspecified chronic kidney disease: Secondary | ICD-10-CM | POA: Diagnosis not present

## 2020-10-21 DIAGNOSIS — N289 Disorder of kidney and ureter, unspecified: Secondary | ICD-10-CM | POA: Diagnosis not present

## 2020-10-21 DIAGNOSIS — E785 Hyperlipidemia, unspecified: Secondary | ICD-10-CM | POA: Diagnosis not present

## 2020-10-21 DIAGNOSIS — N189 Chronic kidney disease, unspecified: Secondary | ICD-10-CM | POA: Diagnosis not present

## 2020-10-21 DIAGNOSIS — I120 Hypertensive chronic kidney disease with stage 5 chronic kidney disease or end stage renal disease: Secondary | ICD-10-CM | POA: Diagnosis present

## 2020-10-21 DIAGNOSIS — E8722 Chronic metabolic acidosis: Secondary | ICD-10-CM | POA: Diagnosis present

## 2020-10-21 DIAGNOSIS — R918 Other nonspecific abnormal finding of lung field: Secondary | ICD-10-CM | POA: Diagnosis not present

## 2020-10-21 DIAGNOSIS — Z7982 Long term (current) use of aspirin: Secondary | ICD-10-CM | POA: Diagnosis not present

## 2020-10-21 DIAGNOSIS — Z515 Encounter for palliative care: Secondary | ICD-10-CM | POA: Diagnosis not present

## 2020-10-21 DIAGNOSIS — J851 Abscess of lung with pneumonia: Secondary | ICD-10-CM | POA: Diagnosis present

## 2020-10-21 DIAGNOSIS — Z79890 Hormone replacement therapy (postmenopausal): Secondary | ICD-10-CM | POA: Diagnosis not present

## 2020-10-21 DIAGNOSIS — R627 Adult failure to thrive: Secondary | ICD-10-CM | POA: Diagnosis present

## 2020-10-21 DIAGNOSIS — R0902 Hypoxemia: Secondary | ICD-10-CM | POA: Diagnosis not present

## 2020-10-21 DIAGNOSIS — N179 Acute kidney failure, unspecified: Secondary | ICD-10-CM | POA: Diagnosis present

## 2020-10-21 DIAGNOSIS — Z87891 Personal history of nicotine dependence: Secondary | ICD-10-CM | POA: Diagnosis not present

## 2020-10-25 IMAGING — CT CT ABD-PELV W/O CM
1 of 2 series · 14 of 32 positions shown, 19 images · non-contrast
Comparison: March 09, 2019

CLINICAL DATA: Status post abdominal aortic aneurysm stent repair.

EXAM:
CT ABDOMEN AND PELVIS WITHOUT CONTRAST
TECHNIQUE: Multidetector CT imaging of the abdomen and pelvis was performed
following the standard protocol without IV contrast.

[Series 2: abd/pelvis w/(date) · axial · 0.79mm/px · z∈[+378,+758]mm · 14 of 86 slices shown, 19 images]
[im 5/86  soft-tissue]
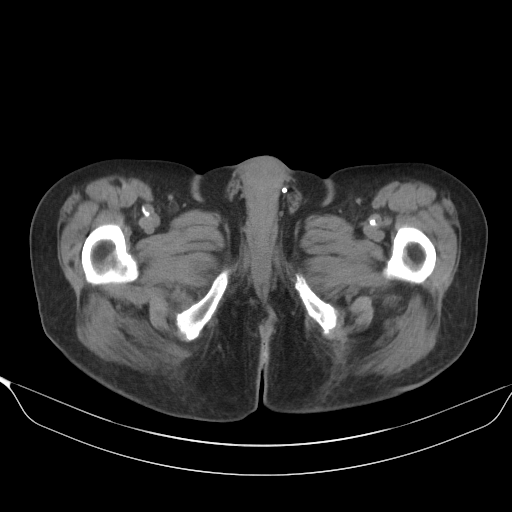
[im 5/86  bone]
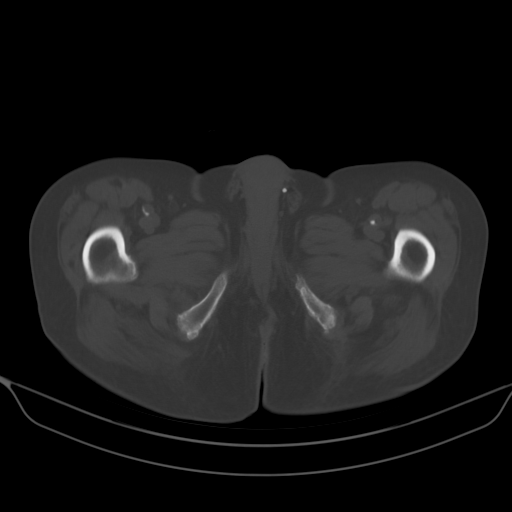
[im 13/86  soft-tissue]
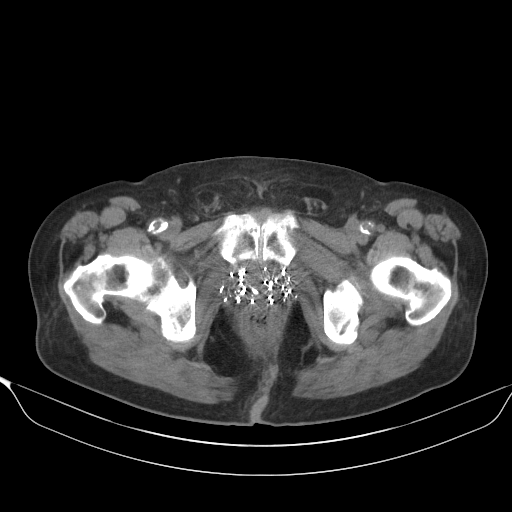
[im 18/86  soft-tissue]
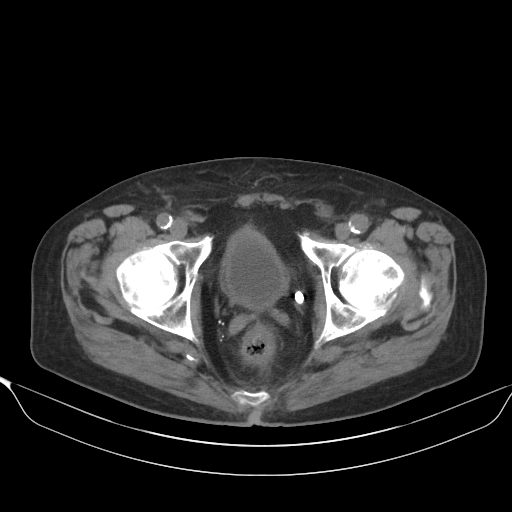
[im 26/86  soft-tissue]
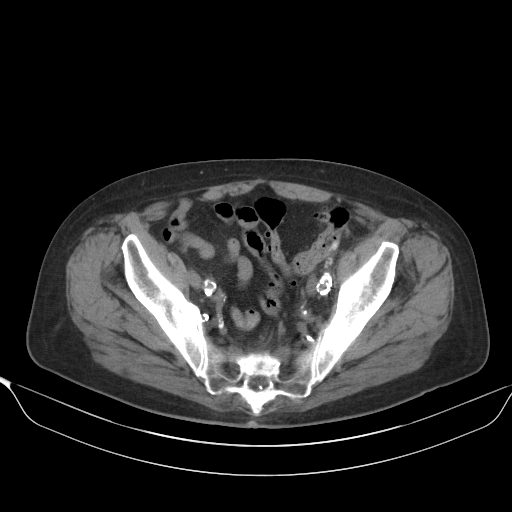
[im 30/86  soft-tissue]
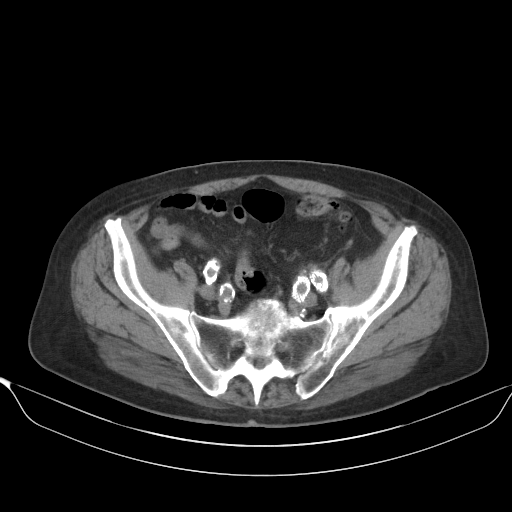
[im 39/86  soft-tissue]
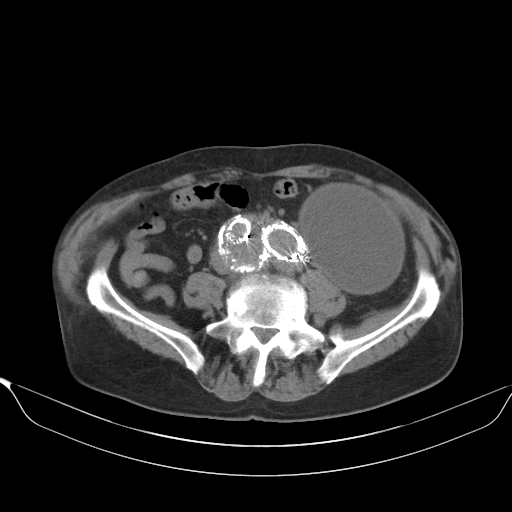
[im 43/86  soft-tissue]
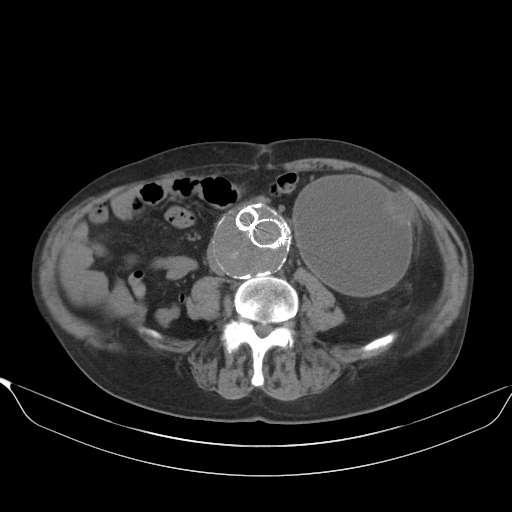
[im 47/86  soft-tissue]
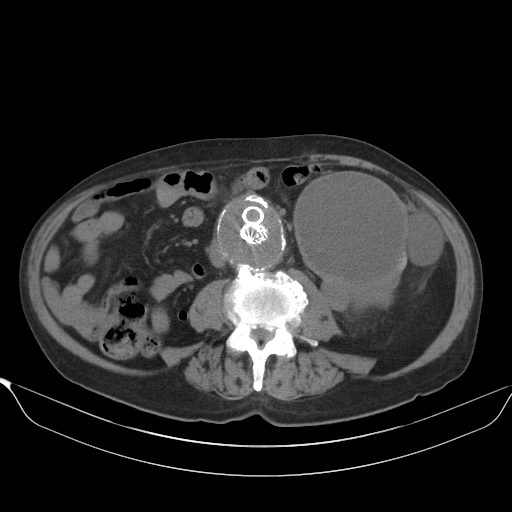
[im 56/86  soft-tissue]
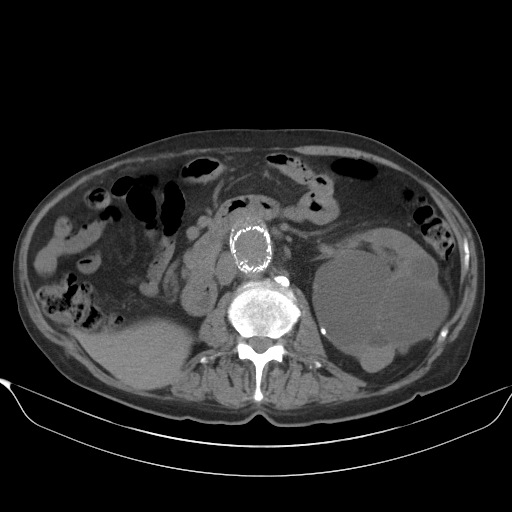
[im 56/86  bone]
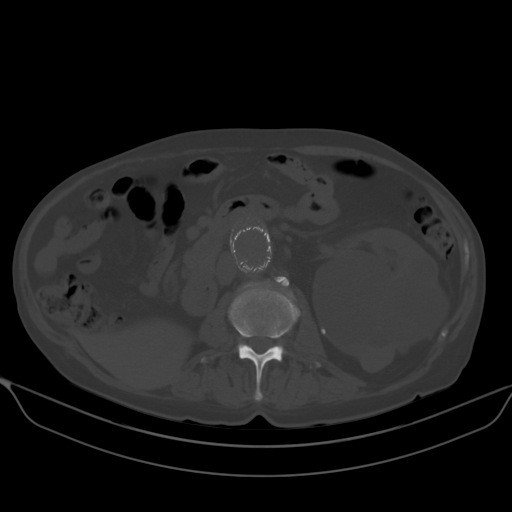
[im 60/86  soft-tissue]
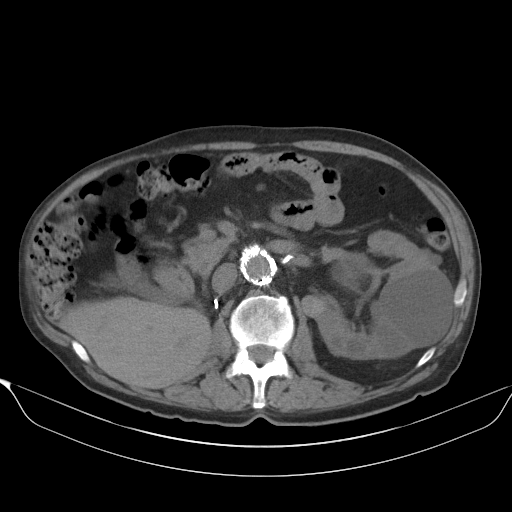
[im 69/86  soft-tissue]
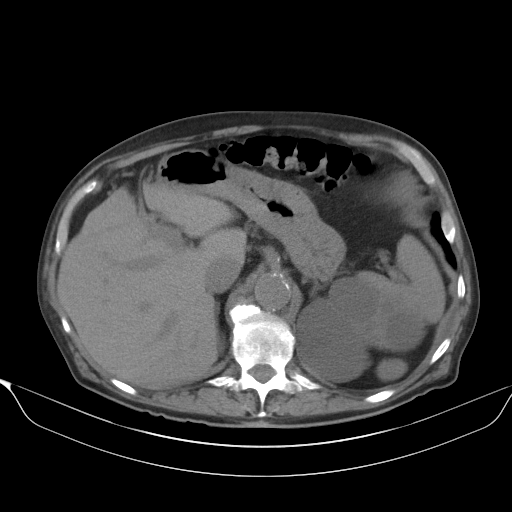
[im 69/86  lung]
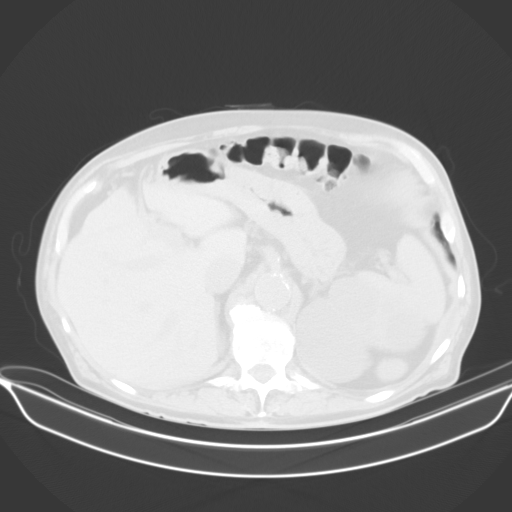
[im 73/86  soft-tissue]
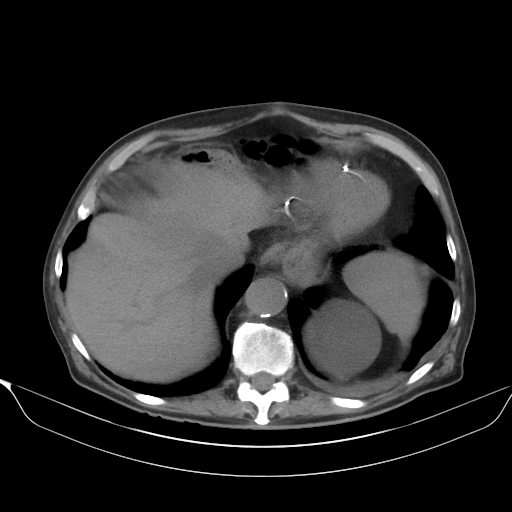
[im 73/86  lung]
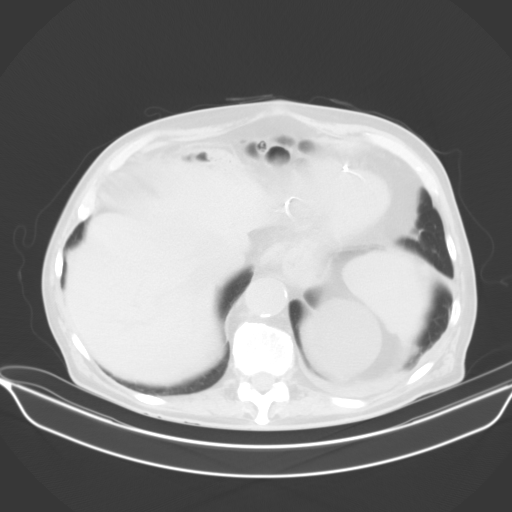
[im 77/86  lung]
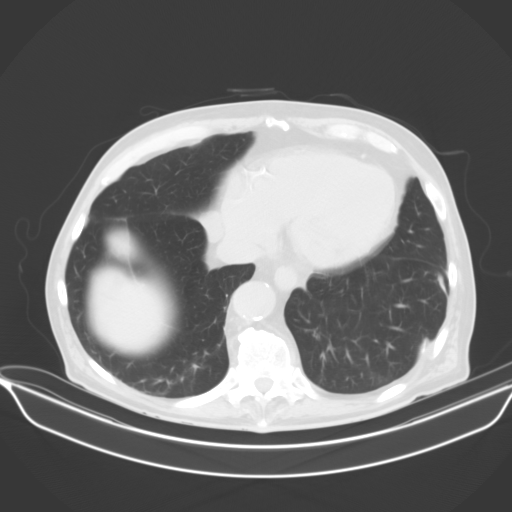
[im 81/86  soft-tissue]
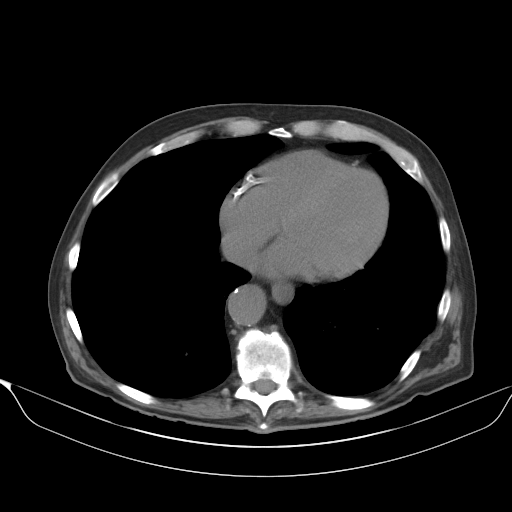
[im 81/86  lung]
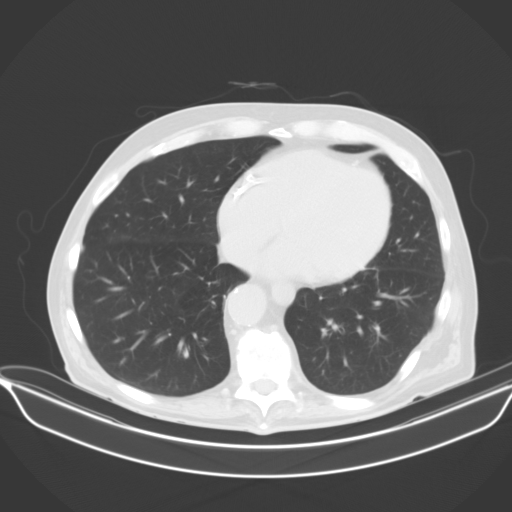

[14 of 32 positions shown; findings below may reference images not displayed]

FINDINGS: Lower chest: Multiple chronic rib fractures are seen along the
posterolateral aspect of the left lung base.

Hepatobiliary: No focal liver abnormality is seen. No gallstones,
gallbladder wall thickening, or biliary dilatation.

Pancreas: Unremarkable. No pancreatic ductal dilatation or
surrounding inflammatory changes.

Spleen: Normal in size without focal abnormality.

Adrenals/Urinary Tract: Adrenal glands are unremarkable. The right
kidney is surgically absent. The left kidney is enlarged, without
renal calculi or hydronephrosis. Numerous large, stable left renal
cysts are seen. Mild diffuse urinary bladder wall thickening is
noted.

Stomach/Bowel: Stomach is within normal limits. Appendix appears
normal. No evidence of bowel wall thickening, distention, or
inflammatory changes. Noninflamed diverticula are seen throughout
the large bowel.

Vascular/Lymphatic: There is evidence of prior stenting of the
infrarenal abdominal aorta and bilateral common iliac arteries. This
represents a new finding when compared to the prior exam.

Reproductive: Multiple prostate radiation implantation seeds are
seen.

Other: No abdominal wall hernia or abnormality. No abdominopelvic
ascites.

Musculoskeletal: Multilevel degenerative changes seen throughout the
lumbar spine.
IMPRESSION: 1. Evidence of interval stenting of the infrarenal abdominal aorta
and bilateral common iliac arteries since the prior study.
2. Numerous large, stable left renal cysts.
3. Colonic diverticulosis.

## 2020-11-26 DEATH — deceased
